# Patient Record
Sex: Female | Born: 1935 | Race: White | Hispanic: No | Marital: Married | Smoking: Never smoker
Health system: Southern US, Community
[De-identification: ages and names within clinical notes are randomized; demographics above are authoritative.]

## PROBLEM LIST (undated history)

## (undated) DIAGNOSIS — I10 Essential (primary) hypertension: Secondary | ICD-10-CM

## (undated) DIAGNOSIS — E785 Hyperlipidemia, unspecified: Secondary | ICD-10-CM

## (undated) HISTORY — PX: ABDOMINAL SURGERY: SHX537

---

## 1998-09-29 ENCOUNTER — Other Ambulatory Visit: Admission: RE | Admit: 1998-09-29 | Discharge: 1998-09-29 | Payer: Self-pay | Admitting: *Deleted

## 1999-09-15 ENCOUNTER — Other Ambulatory Visit: Admission: RE | Admit: 1999-09-15 | Discharge: 1999-09-15 | Payer: Self-pay | Admitting: Family Medicine

## 1999-09-18 ENCOUNTER — Encounter: Admission: RE | Admit: 1999-09-18 | Discharge: 1999-09-18 | Payer: Self-pay | Admitting: Family Medicine

## 1999-09-18 ENCOUNTER — Encounter: Payer: Self-pay | Admitting: Family Medicine

## 1999-12-29 ENCOUNTER — Inpatient Hospital Stay (HOSPITAL_COMMUNITY): Admission: EM | Admit: 1999-12-29 | Discharge: 1999-12-30 | Payer: Self-pay | Admitting: Emergency Medicine

## 1999-12-29 ENCOUNTER — Encounter: Payer: Self-pay | Admitting: Emergency Medicine

## 1999-12-29 ENCOUNTER — Encounter: Payer: Self-pay | Admitting: Anesthesiology

## 1999-12-30 ENCOUNTER — Encounter: Payer: Self-pay | Admitting: Urology

## 2000-01-11 ENCOUNTER — Ambulatory Visit (HOSPITAL_COMMUNITY): Admission: RE | Admit: 2000-01-11 | Discharge: 2000-01-11 | Payer: Self-pay | Admitting: Urology

## 2000-01-11 ENCOUNTER — Encounter: Payer: Self-pay | Admitting: Urology

## 2000-02-02 ENCOUNTER — Encounter: Payer: Self-pay | Admitting: Urology

## 2000-02-02 ENCOUNTER — Encounter: Admission: RE | Admit: 2000-02-02 | Discharge: 2000-02-02 | Payer: Self-pay | Admitting: Urology

## 2000-04-07 ENCOUNTER — Encounter: Payer: Self-pay | Admitting: Urology

## 2000-04-07 ENCOUNTER — Encounter: Admission: RE | Admit: 2000-04-07 | Discharge: 2000-04-07 | Payer: Self-pay | Admitting: Urology

## 2000-05-16 ENCOUNTER — Observation Stay (HOSPITAL_COMMUNITY): Admission: RE | Admit: 2000-05-16 | Discharge: 2000-05-17 | Payer: Self-pay

## 2000-09-19 ENCOUNTER — Encounter: Admission: RE | Admit: 2000-09-19 | Discharge: 2000-09-19 | Payer: Self-pay | Admitting: Family Medicine

## 2000-09-19 ENCOUNTER — Encounter: Payer: Self-pay | Admitting: Family Medicine

## 2000-10-10 ENCOUNTER — Other Ambulatory Visit: Admission: RE | Admit: 2000-10-10 | Discharge: 2000-10-10 | Payer: Self-pay | Admitting: Family Medicine

## 2001-04-28 ENCOUNTER — Encounter: Admission: RE | Admit: 2001-04-28 | Discharge: 2001-04-28 | Payer: Self-pay | Admitting: Urology

## 2001-04-28 ENCOUNTER — Encounter: Payer: Self-pay | Admitting: Urology

## 2001-09-19 ENCOUNTER — Encounter: Admission: RE | Admit: 2001-09-19 | Discharge: 2001-09-19 | Payer: Self-pay | Admitting: Family Medicine

## 2001-09-19 ENCOUNTER — Encounter: Payer: Self-pay | Admitting: Family Medicine

## 2001-10-11 ENCOUNTER — Other Ambulatory Visit: Admission: RE | Admit: 2001-10-11 | Discharge: 2001-10-11 | Payer: Self-pay | Admitting: Family Medicine

## 2002-07-06 ENCOUNTER — Encounter: Payer: Self-pay | Admitting: Family Medicine

## 2002-07-06 ENCOUNTER — Encounter: Admission: RE | Admit: 2002-07-06 | Discharge: 2002-07-06 | Payer: Self-pay | Admitting: Family Medicine

## 2002-09-25 ENCOUNTER — Encounter: Payer: Self-pay | Admitting: Family Medicine

## 2002-09-25 ENCOUNTER — Encounter: Admission: RE | Admit: 2002-09-25 | Discharge: 2002-09-25 | Payer: Self-pay | Admitting: Family Medicine

## 2002-10-11 ENCOUNTER — Other Ambulatory Visit: Admission: RE | Admit: 2002-10-11 | Discharge: 2002-10-11 | Payer: Self-pay | Admitting: Family Medicine

## 2003-10-03 ENCOUNTER — Encounter: Admission: RE | Admit: 2003-10-03 | Discharge: 2003-10-03 | Payer: Self-pay | Admitting: Family Medicine

## 2003-10-16 ENCOUNTER — Other Ambulatory Visit: Admission: RE | Admit: 2003-10-16 | Discharge: 2003-10-16 | Payer: Self-pay | Admitting: Family Medicine

## 2004-10-08 ENCOUNTER — Encounter: Admission: RE | Admit: 2004-10-08 | Discharge: 2004-10-08 | Payer: Self-pay | Admitting: Family Medicine

## 2004-10-20 ENCOUNTER — Other Ambulatory Visit: Admission: RE | Admit: 2004-10-20 | Discharge: 2004-10-20 | Payer: Self-pay | Admitting: Family Medicine

## 2005-11-01 ENCOUNTER — Encounter: Admission: RE | Admit: 2005-11-01 | Discharge: 2005-11-01 | Payer: Self-pay | Admitting: Family Medicine

## 2005-12-24 ENCOUNTER — Other Ambulatory Visit: Admission: RE | Admit: 2005-12-24 | Discharge: 2005-12-24 | Payer: Self-pay | Admitting: Family Medicine

## 2006-11-04 ENCOUNTER — Encounter: Admission: RE | Admit: 2006-11-04 | Discharge: 2006-11-04 | Payer: Self-pay | Admitting: Family Medicine

## 2007-01-02 ENCOUNTER — Encounter: Admission: RE | Admit: 2007-01-02 | Discharge: 2007-01-02 | Payer: Self-pay | Admitting: Family Medicine

## 2007-12-20 ENCOUNTER — Encounter: Admission: RE | Admit: 2007-12-20 | Discharge: 2007-12-20 | Payer: Self-pay | Admitting: Family Medicine

## 2008-02-23 ENCOUNTER — Other Ambulatory Visit: Admission: RE | Admit: 2008-02-23 | Discharge: 2008-02-23 | Payer: Self-pay | Admitting: Family Medicine

## 2009-02-12 ENCOUNTER — Encounter: Admission: RE | Admit: 2009-02-12 | Discharge: 2009-02-12 | Payer: Self-pay | Admitting: Geriatric Medicine

## 2010-03-05 ENCOUNTER — Encounter: Admission: RE | Admit: 2010-03-05 | Discharge: 2010-03-05 | Payer: Self-pay | Admitting: Geriatric Medicine

## 2011-01-22 NOTE — Discharge Summary (Signed)
[facility]  Patient:    Felicia Stephens, Felicia Stephens                       MRN: [id number] Adm. Date:  [id number] Disc. Date: [id number] Attending:  Londell Moh CC:         Milus Mallick, M.D.                           Discharge Summary  ADMISSION DIAGNOSES: 1. Ureteral calculus. 2. Hydronephrosis. 3. Septicemia.  PRINCIPAL PROCEDURES: 1. Cystoscopy. 2. Retrograde ureteropyelogram. 3. Double-J catheter insertion.  HISTORY OF PRESENT ILLNESS:  This 77 year old female is known to have a right-sided kidney stone.  The patient had abdominal pain and was seen in the emergency room by Milus Mallick, M.D., who felt that she might have an appendiceal abscess.  The CT scan showed marked hydronephrosis with a 1.4 cm stone in the proximal ureter.  The patient had a temperature that got as high as 105 degrees.  She was admitted for urgent placement of a stoma, as well as antibiotic therapy.  PAST MEDICAL HISTORY:  Remarkable for abdominal hernia.  She has a past history of urinary tract infections.  MEDICATIONS:  She takes no medications other than Claritin.  FAMILY HISTORY:  Has been placed on the chart.  SOCIAL HISTORY:  Has been placed on the chart.  REVIEW OF SYSTEMS:  Has been placed on the chart.  HOSPITAL COURSE:  The patient was taken to the operating room where she underwent immediate retrograde insertion of a double-J catheter.  The patient almost immediately felt better and defervesced.  The patient had some difficulty emptying her bladder and had 100 cc catheterized out, but later on that morning the patient was able to void without difficulty.  DISPOSITION:  She was afebrile and it was felt safe to send her home on appropriate medicines.  DISCHARGE MEDICATIONS:  The patient was given Levaquin and Darvocet.  FOLLOW-UP:  She will be returning later this week for ESWL to the stone. DD:  01/19/00 TD:  01/19/00 Job: [id number]

## 2011-01-22 NOTE — Op Note (Signed)
[facility]  Patient:    LAURELLE, SKIVER                       MRN: [id number] Proc. Date: 05/16/00 Adm. Date:  [id number] Attending:  Gennie Alma CC:         Vale Haven. Andrey Campanile, M.D.   Operative Report  CENTRAL Icard NUMBER:  [id number]  PREOPERATIVE DIAGNOSIS:  Large ventral hernia of suprapubic region.  POSTOPERATIVE DIAGNOSIS:  Large ventral hernia of suprapubic region.  OPERATION:  Repair of large ventral hernia with mesh.  SURGEON:  Milus Mallick, M.D.  ASSISTANT:  Rose Phi. Maple Hudson, M.D.  ANESTHESIA:  General endotracheal.  DESCRIPTION OF PROCEDURE:  Under adequate general endotracheal anesthesia, the patients abdomen wass prepared and draped in the usual fashion.  There wa s a grapefruit size ventral hernia just above the umbilicus.  It was chronically incarcerated but was able to be reduced under anesthesia.  A 6 inch transverse incision was made overlying it above the umbilicus and almost immediately we were within the hernia sac.  There was a transverse colon in the sac and some omentum and this was reduced easily.  The sac was dissected away from the subcutaneous tissue all around it and the fascia was exposed all around the defect.  The actual defect in the fascia was 3 to 4 cm in diameter.   After all the fascia was exposed and a border around the defect approximately 4 or 5 cm in width, the sac was excised using Bovie electrocoagulation.  The sac was then repaired by inserting atrium mesh within the abdominal cavity and placing it behind the abdominal wall.  A piece of mesh measuring 7.5 x 10 cm was inserted into the defect and fixed up to the abdominal wall with interrupted vertical mattress sutures of 0 Novofil.  The defect was then closed over the mesh with interrupted sutures of 0 Novofil.  The umbilicus was fixed down to the fascia with 3-0 Vicryl.  A 19 Blake drain was inserted into the wound and brought out through  the stab wound in the left inferior flap. It was sewed to the skin with 3-0 nylon.  The subcutaneous fat was reapproximated with interrupted sutures of 3-0 Vicryl and the skin was approximated with a generic skin staple 35-W.  A sterile dressing was applied. Estimated blood loss for the procedure was negligible.  The patient tolerated the procedure well and left the operating room in satisfactory condition. DD:  05/16/00 TD:  05/17/00 Job: [id number]

## 2011-03-23 ENCOUNTER — Other Ambulatory Visit: Payer: Self-pay | Admitting: Geriatric Medicine

## 2011-03-23 DIAGNOSIS — Z1231 Encounter for screening mammogram for malignant neoplasm of breast: Secondary | ICD-10-CM

## 2011-04-05 ENCOUNTER — Ambulatory Visit: Payer: Self-pay

## 2011-04-19 ENCOUNTER — Ambulatory Visit
Admission: RE | Admit: 2011-04-19 | Discharge: 2011-04-19 | Disposition: A | Discharge status: Home / Self Care | Payer: Medicare Other | Source: Ambulatory Visit | Attending: Geriatric Medicine | Admitting: Geriatric Medicine

## 2011-04-19 DIAGNOSIS — Z1231 Encounter for screening mammogram for malignant neoplasm of breast: Secondary | ICD-10-CM

## 2011-05-18 ENCOUNTER — Other Ambulatory Visit (HOSPITAL_COMMUNITY)
Admission: RE | Admit: 2011-05-18 | Discharge: 2011-05-18 | Disposition: A | Discharge status: Home / Self Care | Payer: Medicare Other | Source: Ambulatory Visit | Attending: Geriatric Medicine | Admitting: Geriatric Medicine

## 2011-05-18 ENCOUNTER — Other Ambulatory Visit: Payer: Self-pay | Admitting: Geriatric Medicine

## 2011-05-18 DIAGNOSIS — Z1159 Encounter for screening for other viral diseases: Secondary | ICD-10-CM | POA: Insufficient documentation

## 2011-05-18 DIAGNOSIS — Z124 Encounter for screening for malignant neoplasm of cervix: Secondary | ICD-10-CM | POA: Insufficient documentation

## 2011-08-03 ENCOUNTER — Other Ambulatory Visit: Payer: Self-pay | Admitting: Dermatology

## 2011-11-02 DIAGNOSIS — J019 Acute sinusitis, unspecified: Secondary | ICD-10-CM | POA: Diagnosis not present

## 2011-11-05 DIAGNOSIS — L600 Ingrowing nail: Secondary | ICD-10-CM | POA: Diagnosis not present

## 2011-11-15 DIAGNOSIS — I8390 Asymptomatic varicose veins of unspecified lower extremity: Secondary | ICD-10-CM | POA: Diagnosis not present

## 2011-11-15 DIAGNOSIS — J309 Allergic rhinitis, unspecified: Secondary | ICD-10-CM | POA: Diagnosis not present

## 2011-11-15 DIAGNOSIS — Z79899 Other long term (current) drug therapy: Secondary | ICD-10-CM | POA: Diagnosis not present

## 2011-11-15 DIAGNOSIS — H6120 Impacted cerumen, unspecified ear: Secondary | ICD-10-CM | POA: Diagnosis not present

## 2011-11-15 DIAGNOSIS — I10 Essential (primary) hypertension: Secondary | ICD-10-CM | POA: Diagnosis not present

## 2011-11-15 DIAGNOSIS — E7800 Pure hypercholesterolemia, unspecified: Secondary | ICD-10-CM | POA: Diagnosis not present

## 2011-12-01 DIAGNOSIS — M1710 Unilateral primary osteoarthritis, unspecified knee: Secondary | ICD-10-CM | POA: Diagnosis not present

## 2011-12-01 DIAGNOSIS — M545 Low back pain: Secondary | ICD-10-CM | POA: Diagnosis not present

## 2011-12-27 DIAGNOSIS — I10 Essential (primary) hypertension: Secondary | ICD-10-CM | POA: Diagnosis not present

## 2011-12-27 DIAGNOSIS — Z79899 Other long term (current) drug therapy: Secondary | ICD-10-CM | POA: Diagnosis not present

## 2011-12-27 DIAGNOSIS — M25569 Pain in unspecified knee: Secondary | ICD-10-CM | POA: Diagnosis not present

## 2012-04-18 ENCOUNTER — Other Ambulatory Visit: Payer: Self-pay | Admitting: Geriatric Medicine

## 2012-04-18 DIAGNOSIS — Z1231 Encounter for screening mammogram for malignant neoplasm of breast: Secondary | ICD-10-CM

## 2012-04-24 DIAGNOSIS — E7800 Pure hypercholesterolemia, unspecified: Secondary | ICD-10-CM | POA: Diagnosis not present

## 2012-04-24 DIAGNOSIS — Z79899 Other long term (current) drug therapy: Secondary | ICD-10-CM | POA: Diagnosis not present

## 2012-04-24 DIAGNOSIS — I10 Essential (primary) hypertension: Secondary | ICD-10-CM | POA: Diagnosis not present

## 2012-05-02 ENCOUNTER — Ambulatory Visit
Admission: RE | Admit: 2012-05-02 | Discharge: 2012-05-02 | Disposition: A | Discharge status: Home / Self Care | Payer: Medicare Other | Source: Ambulatory Visit | Attending: Geriatric Medicine | Admitting: Geriatric Medicine

## 2012-05-02 DIAGNOSIS — Z1231 Encounter for screening mammogram for malignant neoplasm of breast: Secondary | ICD-10-CM

## 2012-06-01 DIAGNOSIS — Z23 Encounter for immunization: Secondary | ICD-10-CM | POA: Diagnosis not present

## 2012-10-31 DIAGNOSIS — Z79899 Other long term (current) drug therapy: Secondary | ICD-10-CM | POA: Diagnosis not present

## 2012-10-31 DIAGNOSIS — Z0000 Encounter for general adult medical examination without abnormal findings: Secondary | ICD-10-CM | POA: Diagnosis not present

## 2012-10-31 DIAGNOSIS — Z1331 Encounter for screening for depression: Secondary | ICD-10-CM | POA: Diagnosis not present

## 2012-10-31 DIAGNOSIS — I10 Essential (primary) hypertension: Secondary | ICD-10-CM | POA: Diagnosis not present

## 2012-10-31 DIAGNOSIS — E7800 Pure hypercholesterolemia, unspecified: Secondary | ICD-10-CM | POA: Diagnosis not present

## 2012-10-31 DIAGNOSIS — Z23 Encounter for immunization: Secondary | ICD-10-CM | POA: Diagnosis not present

## 2013-03-28 DIAGNOSIS — E7800 Pure hypercholesterolemia, unspecified: Secondary | ICD-10-CM | POA: Diagnosis not present

## 2013-03-28 DIAGNOSIS — I10 Essential (primary) hypertension: Secondary | ICD-10-CM | POA: Diagnosis not present

## 2013-05-10 ENCOUNTER — Other Ambulatory Visit: Payer: Self-pay | Admitting: Gastroenterology

## 2013-05-10 DIAGNOSIS — D126 Benign neoplasm of colon, unspecified: Secondary | ICD-10-CM | POA: Diagnosis not present

## 2013-05-10 DIAGNOSIS — K5730 Diverticulosis of large intestine without perforation or abscess without bleeding: Secondary | ICD-10-CM | POA: Diagnosis not present

## 2013-05-10 DIAGNOSIS — Z1211 Encounter for screening for malignant neoplasm of colon: Secondary | ICD-10-CM | POA: Diagnosis not present

## 2013-06-28 ENCOUNTER — Other Ambulatory Visit: Payer: Self-pay

## 2013-06-28 DIAGNOSIS — Z1231 Encounter for screening mammogram for malignant neoplasm of breast: Secondary | ICD-10-CM

## 2013-07-03 DIAGNOSIS — Z23 Encounter for immunization: Secondary | ICD-10-CM | POA: Diagnosis not present

## 2013-07-27 ENCOUNTER — Ambulatory Visit (INDEPENDENT_AMBULATORY_CARE_PROVIDER_SITE_OTHER): Payer: Medicare Other

## 2013-07-27 VITALS — BP 165/89 | HR 79 | Resp 12 | Ht 68.0 in | Wt 170.0 lb

## 2013-07-27 DIAGNOSIS — L600 Ingrowing nail: Secondary | ICD-10-CM | POA: Diagnosis not present

## 2013-07-27 DIAGNOSIS — L03039 Cellulitis of unspecified toe: Secondary | ICD-10-CM

## 2013-07-27 MED ORDER — CEPHALEXIN 500 MG PO CAPS: 500.0000 mg | ORAL_CAPSULE | Freq: Three times a day (TID) | ORAL | Status: DC

## 2013-07-27 NOTE — Progress Notes (Signed)
  Subjective:    Patient ID: Felicia Stephens, female    DOB: [birth year], 79 y.o.   MRN: [id number]  HPI Comments: '' RT FOOT BIG TOENAIL IS SORE FOR  2 WEEKS AND ITS GETTING WORSE. I JUST SOAK IT AS NEEDED, BUT NO TREATMENT''     Review of Systems  Skin: Positive for color change.  Allergic/Immunologic: Positive for environmental allergies.  All other systems reviewed and are negative.       Objective:   Physical Exam Neurovascular status is intact with pedal pulses palpable DP postal for PT pulse one over 4 bilateral. Refill time 3 seconds all digits. Skin temperature warm turgor normal mild varicosities noted. Orthopedic exam remarkable for semirigid digital contractures 2 through 5 bilateral. Hallux is rectus. Dermatologically there is keratosis incurvation of right hallux nail lateral border painful tender with the edema and erythema lateral nail fold being noted. There is no purulent discharge or drainage noted slight serous bloody drainage of the border is identified. Patient had similar nail procedure nail problem done on the contralateral left foot just over a year ago.      Assessment & Plan:  Assessment ingrowing nail with paronychia right great toe lateral border. Plan at this time is nail excision partial with phenol matricectomy. At this time local anesthetic block Mr. total of 3 cc 50-50 mixture of 2% Xylocaine plain and 0.5% Marcaine plain in a digital block fashion. Hemostasis was acquired with a Coflex wrap in and rubber band tourniquet. At this time the lateral border is excised in a longitudinal fashion. Phenol matricectomy x3 applications followed by alcohol wash Silvadene cream and a gauze dressing being applied. Patient tolerated procedure well was given instructions for daily Betadine water soaks prescription for cephalexin 500 mg 3 times a day x10 days an appointment for followup ulcers and have been made. Recommended Tylenol as needed for pain recheck in 2-3 weeks for  followup for nail check. Maintain comfortable shoes in the interim.  Alvan Dame DPM

## 2013-07-27 NOTE — Patient Instructions (Signed)

## 2013-07-30 ENCOUNTER — Ambulatory Visit
Admission: RE | Admit: 2013-07-30 | Discharge: 2013-07-30 | Disposition: A | Discharge status: Home / Self Care | Payer: Medicare Other | Source: Ambulatory Visit

## 2013-07-30 DIAGNOSIS — Z1231 Encounter for screening mammogram for malignant neoplasm of breast: Secondary | ICD-10-CM | POA: Diagnosis not present

## 2013-08-14 ENCOUNTER — Ambulatory Visit (INDEPENDENT_AMBULATORY_CARE_PROVIDER_SITE_OTHER): Payer: Medicare Other

## 2013-08-14 VITALS — BP 152/88 | HR 79 | Resp 12

## 2013-08-14 DIAGNOSIS — Z09 Encounter for follow-up examination after completed treatment for conditions other than malignant neoplasm: Secondary | ICD-10-CM

## 2013-08-14 DIAGNOSIS — L600 Ingrowing nail: Secondary | ICD-10-CM

## 2013-08-14 NOTE — Patient Instructions (Signed)

## 2013-08-14 NOTE — Progress Notes (Signed)
   Subjective:    Patient ID: Felicia Stephens, female    DOB: [birth year], 79 y.o.   MRN: [id number]  HPI patient presents at this time following AP nail procedure of the right great toe border doing well no discharge or drainage is noted    Review of Systems deferred at this visit     Objective:   Physical Exam Neurovascular status is intact with palpable pedal pulses DP and PT +2/4. Refill timed 3-4 seconds there is no discharge or drainage the nail appears to be completely healed suggest monitoring and recheck in the future and as-needed basis no pain or discomfort noted slight eschar present should followup with the next month.       Assessment & Plan:  Assessment good postop progress following AP nail procedure right great toe lateral border patient discharged from our care at this time to an as-needed basis  Alvan Dame DPM

## 2013-12-13 DIAGNOSIS — J209 Acute bronchitis, unspecified: Secondary | ICD-10-CM | POA: Diagnosis not present

## 2013-12-13 DIAGNOSIS — J309 Allergic rhinitis, unspecified: Secondary | ICD-10-CM | POA: Diagnosis not present

## 2013-12-21 DIAGNOSIS — Z23 Encounter for immunization: Secondary | ICD-10-CM | POA: Diagnosis not present

## 2013-12-21 DIAGNOSIS — I10 Essential (primary) hypertension: Secondary | ICD-10-CM | POA: Diagnosis not present

## 2013-12-21 DIAGNOSIS — Z1331 Encounter for screening for depression: Secondary | ICD-10-CM | POA: Diagnosis not present

## 2013-12-21 DIAGNOSIS — Z0000 Encounter for general adult medical examination without abnormal findings: Secondary | ICD-10-CM | POA: Diagnosis not present

## 2013-12-21 DIAGNOSIS — Z79899 Other long term (current) drug therapy: Secondary | ICD-10-CM | POA: Diagnosis not present

## 2013-12-21 DIAGNOSIS — J301 Allergic rhinitis due to pollen: Secondary | ICD-10-CM | POA: Diagnosis not present

## 2013-12-21 DIAGNOSIS — E7800 Pure hypercholesterolemia, unspecified: Secondary | ICD-10-CM | POA: Diagnosis not present

## 2014-05-23 DIAGNOSIS — H26499 Other secondary cataract, unspecified eye: Secondary | ICD-10-CM | POA: Diagnosis not present

## 2014-05-23 DIAGNOSIS — Z961 Presence of intraocular lens: Secondary | ICD-10-CM | POA: Diagnosis not present

## 2014-06-05 DIAGNOSIS — H26499 Other secondary cataract, unspecified eye: Secondary | ICD-10-CM | POA: Diagnosis not present

## 2014-06-14 DIAGNOSIS — Z23 Encounter for immunization: Secondary | ICD-10-CM | POA: Diagnosis not present

## 2014-06-24 DIAGNOSIS — M859 Disorder of bone density and structure, unspecified: Secondary | ICD-10-CM | POA: Diagnosis not present

## 2014-06-24 DIAGNOSIS — E780 Pure hypercholesterolemia: Secondary | ICD-10-CM | POA: Diagnosis not present

## 2014-06-24 DIAGNOSIS — I10 Essential (primary) hypertension: Secondary | ICD-10-CM | POA: Diagnosis not present

## 2014-07-17 DIAGNOSIS — M8580 Other specified disorders of bone density and structure, unspecified site: Secondary | ICD-10-CM | POA: Diagnosis not present

## 2014-07-24 DIAGNOSIS — M8580 Other specified disorders of bone density and structure, unspecified site: Secondary | ICD-10-CM | POA: Diagnosis not present

## 2014-08-06 ENCOUNTER — Other Ambulatory Visit: Payer: Self-pay

## 2014-08-06 DIAGNOSIS — Z1231 Encounter for screening mammogram for malignant neoplasm of breast: Secondary | ICD-10-CM

## 2014-08-21 ENCOUNTER — Ambulatory Visit
Admission: RE | Admit: 2014-08-21 | Discharge: 2014-08-21 | Disposition: A | Discharge status: Home / Self Care | Payer: Medicare Other | Source: Ambulatory Visit

## 2014-08-21 DIAGNOSIS — Z1231 Encounter for screening mammogram for malignant neoplasm of breast: Secondary | ICD-10-CM | POA: Diagnosis not present

## 2014-12-30 DIAGNOSIS — E780 Pure hypercholesterolemia: Secondary | ICD-10-CM | POA: Diagnosis not present

## 2014-12-30 DIAGNOSIS — I10 Essential (primary) hypertension: Secondary | ICD-10-CM | POA: Diagnosis not present

## 2014-12-30 DIAGNOSIS — Z1389 Encounter for screening for other disorder: Secondary | ICD-10-CM | POA: Diagnosis not present

## 2014-12-30 DIAGNOSIS — Z79899 Other long term (current) drug therapy: Secondary | ICD-10-CM | POA: Diagnosis not present

## 2014-12-30 DIAGNOSIS — Z0000 Encounter for general adult medical examination without abnormal findings: Secondary | ICD-10-CM | POA: Diagnosis not present

## 2015-06-17 DIAGNOSIS — Z23 Encounter for immunization: Secondary | ICD-10-CM | POA: Diagnosis not present

## 2015-07-01 DIAGNOSIS — I10 Essential (primary) hypertension: Secondary | ICD-10-CM | POA: Diagnosis not present

## 2015-10-01 ENCOUNTER — Other Ambulatory Visit: Payer: Self-pay

## 2015-10-01 DIAGNOSIS — Z1231 Encounter for screening mammogram for malignant neoplasm of breast: Secondary | ICD-10-CM

## 2015-10-16 ENCOUNTER — Other Ambulatory Visit: Payer: Self-pay | Admitting: Nurse Practitioner

## 2015-10-16 ENCOUNTER — Ambulatory Visit
Admission: RE | Admit: 2015-10-16 | Discharge: 2015-10-16 | Disposition: A | Discharge status: Home / Self Care | Payer: Medicare Other | Source: Ambulatory Visit | Attending: Nurse Practitioner | Admitting: Nurse Practitioner

## 2015-10-16 DIAGNOSIS — J40 Bronchitis, not specified as acute or chronic: Secondary | ICD-10-CM

## 2015-10-23 ENCOUNTER — Ambulatory Visit
Admission: RE | Admit: 2015-10-23 | Discharge: 2015-10-23 | Disposition: A | Discharge status: Home / Self Care | Payer: Medicare Other | Source: Ambulatory Visit

## 2015-10-23 DIAGNOSIS — Z1231 Encounter for screening mammogram for malignant neoplasm of breast: Secondary | ICD-10-CM

## 2016-11-09 ENCOUNTER — Other Ambulatory Visit: Payer: Self-pay | Admitting: Geriatric Medicine

## 2016-11-09 DIAGNOSIS — Z1231 Encounter for screening mammogram for malignant neoplasm of breast: Secondary | ICD-10-CM

## 2016-11-26 ENCOUNTER — Ambulatory Visit
Admission: RE | Admit: 2016-11-26 | Discharge: 2016-11-26 | Disposition: A | Discharge status: Home / Self Care | Payer: Medicare Other | Source: Ambulatory Visit | Attending: Geriatric Medicine | Admitting: Geriatric Medicine

## 2016-11-26 DIAGNOSIS — Z1231 Encounter for screening mammogram for malignant neoplasm of breast: Secondary | ICD-10-CM

## 2017-12-30 ENCOUNTER — Other Ambulatory Visit: Payer: Self-pay | Admitting: Geriatric Medicine

## 2017-12-30 DIAGNOSIS — Z1231 Encounter for screening mammogram for malignant neoplasm of breast: Secondary | ICD-10-CM

## 2018-01-24 ENCOUNTER — Ambulatory Visit
Admission: RE | Admit: 2018-01-24 | Discharge: 2018-01-24 | Disposition: A | Discharge status: Home / Self Care | Payer: Medicare Other | Source: Ambulatory Visit | Attending: Geriatric Medicine | Admitting: Geriatric Medicine

## 2018-01-24 DIAGNOSIS — Z1231 Encounter for screening mammogram for malignant neoplasm of breast: Secondary | ICD-10-CM

## 2018-12-20 ENCOUNTER — Emergency Department (HOSPITAL_COMMUNITY): Payer: Medicare Other

## 2018-12-20 ENCOUNTER — Inpatient Hospital Stay (HOSPITAL_COMMUNITY)
Admission: EM | Admit: 2018-12-20 | Discharge: 2018-12-29 | DRG: 329 | Disposition: A | Discharge status: Home Health | Payer: Medicare Other | Attending: Internal Medicine | Admitting: Internal Medicine

## 2018-12-20 ENCOUNTER — Encounter (HOSPITAL_COMMUNITY): Payer: Self-pay | Admitting: Emergency Medicine

## 2018-12-20 ENCOUNTER — Other Ambulatory Visit: Payer: Self-pay

## 2018-12-20 DIAGNOSIS — K430 Incisional hernia with obstruction, without gangrene: Secondary | ICD-10-CM | POA: Diagnosis not present

## 2018-12-20 DIAGNOSIS — E871 Hypo-osmolality and hyponatremia: Secondary | ICD-10-CM | POA: Diagnosis present

## 2018-12-20 DIAGNOSIS — Z79899 Other long term (current) drug therapy: Secondary | ICD-10-CM

## 2018-12-20 DIAGNOSIS — Z72820 Sleep deprivation: Secondary | ICD-10-CM

## 2018-12-20 DIAGNOSIS — D62 Acute posthemorrhagic anemia: Secondary | ICD-10-CM | POA: Diagnosis not present

## 2018-12-20 DIAGNOSIS — R112 Nausea with vomiting, unspecified: Secondary | ICD-10-CM | POA: Diagnosis not present

## 2018-12-20 DIAGNOSIS — K436 Other and unspecified ventral hernia with obstruction, without gangrene: Secondary | ICD-10-CM

## 2018-12-20 DIAGNOSIS — K56609 Unspecified intestinal obstruction, unspecified as to partial versus complete obstruction: Secondary | ICD-10-CM

## 2018-12-20 DIAGNOSIS — I10 Essential (primary) hypertension: Secondary | ICD-10-CM | POA: Diagnosis present

## 2018-12-20 DIAGNOSIS — H9190 Unspecified hearing loss, unspecified ear: Secondary | ICD-10-CM | POA: Diagnosis present

## 2018-12-20 DIAGNOSIS — Z978 Presence of other specified devices: Secondary | ICD-10-CM

## 2018-12-20 DIAGNOSIS — E860 Dehydration: Secondary | ICD-10-CM | POA: Diagnosis present

## 2018-12-20 DIAGNOSIS — Z7951 Long term (current) use of inhaled steroids: Secondary | ICD-10-CM

## 2018-12-20 DIAGNOSIS — E876 Hypokalemia: Secondary | ICD-10-CM | POA: Diagnosis present

## 2018-12-20 DIAGNOSIS — N179 Acute kidney failure, unspecified: Secondary | ICD-10-CM | POA: Diagnosis present

## 2018-12-20 DIAGNOSIS — G9341 Metabolic encephalopathy: Secondary | ICD-10-CM | POA: Diagnosis present

## 2018-12-20 DIAGNOSIS — K559 Vascular disorder of intestine, unspecified: Secondary | ICD-10-CM | POA: Diagnosis present

## 2018-12-20 DIAGNOSIS — E785 Hyperlipidemia, unspecified: Secondary | ICD-10-CM | POA: Diagnosis present

## 2018-12-20 DIAGNOSIS — E872 Acidosis: Secondary | ICD-10-CM | POA: Diagnosis present

## 2018-12-20 DIAGNOSIS — E878 Other disorders of electrolyte and fluid balance, not elsewhere classified: Secondary | ICD-10-CM | POA: Diagnosis present

## 2018-12-20 HISTORY — DX: Essential (primary) hypertension: I10

## 2018-12-20 HISTORY — DX: Hyperlipidemia, unspecified: E78.5

## 2018-12-20 LAB — CBC WITH DIFFERENTIAL/PLATELET
Abs Immature Granulocytes: 0.1 10*3/uL — ABNORMAL HIGH (ref 0.00–0.07)
Basophils Absolute: 0 10*3/uL (ref 0.0–0.1)
Basophils Relative: 0 %
Eosinophils Absolute: 0 10*3/uL (ref 0.0–0.5)
Eosinophils Relative: 0 %
HCT: 47.7 % — ABNORMAL HIGH (ref 36.0–46.0)
Hemoglobin: 16.2 g/dL — ABNORMAL HIGH (ref 12.0–15.0)
Lymphocytes Relative: 6 %
Lymphs Abs: 0.8 10*3/uL (ref 0.7–4.0)
MCH: 31.3 pg (ref 26.0–34.0)
MCHC: 34 g/dL (ref 30.0–36.0)
MCV: 92.1 fL (ref 80.0–100.0)
Metamyelocytes Relative: 1 %
Monocytes Absolute: 1.6 10*3/uL — ABNORMAL HIGH (ref 0.1–1.0)
Monocytes Relative: 12 %
Neutro Abs: 10.6 10*3/uL — ABNORMAL HIGH (ref 1.7–7.7)
Neutrophils Relative %: 81 %
Platelets: 310 10*3/uL (ref 150–400)
RBC: 5.18 MIL/uL — ABNORMAL HIGH (ref 3.87–5.11)
RDW: 12.7 % (ref 11.5–15.5)
WBC: 13.1 10*3/uL — ABNORMAL HIGH (ref 4.0–10.5)
nRBC: 0 % (ref 0.0–0.2)
nRBC: 0 /100 WBC

## 2018-12-20 LAB — COMPREHENSIVE METABOLIC PANEL
ALT: 12 U/L (ref 0–44)
AST: 22 U/L (ref 15–41)
Albumin: 3.1 g/dL — ABNORMAL LOW (ref 3.5–5.0)
Alkaline Phosphatase: 80 U/L (ref 38–126)
Anion gap: 13 (ref 5–15)
BUN: 50 mg/dL — ABNORMAL HIGH (ref 8–23)
CO2: 23 mmol/L (ref 22–32)
Calcium: 10.2 mg/dL (ref 8.9–10.3)
Chloride: 94 mmol/L — ABNORMAL LOW (ref 98–111)
Creatinine, Ser: 2.24 mg/dL — ABNORMAL HIGH (ref 0.44–1.00)
GFR calc Af Amer: 23 mL/min — ABNORMAL LOW (ref >60)
GFR calc non Af Amer: 20 mL/min — ABNORMAL LOW (ref >60)
Glucose, Bld: 128 mg/dL — ABNORMAL HIGH (ref 70–99)
Potassium: 3.4 mmol/L — ABNORMAL LOW (ref 3.5–5.1)
Sodium: 130 mmol/L — ABNORMAL LOW (ref 135–145)
Total Bilirubin: 3.5 mg/dL — ABNORMAL HIGH (ref 0.3–1.2)
Total Protein: 6.9 g/dL (ref 6.5–8.1)

## 2018-12-20 LAB — LACTIC ACID, PLASMA
Lactic Acid, Venous: 2.3 mmol/L (ref 0.5–1.9)
Lactic Acid, Venous: 2.1 mmol/L (ref 0.5–1.9)

## 2018-12-20 LAB — LIPASE, BLOOD: Lipase: 23 U/L (ref 11–51)

## 2018-12-20 MED ORDER — FENTANYL CITRATE (PF) 100 MCG/2ML IJ SOLN
50.0000 ug | Freq: Once | INTRAMUSCULAR | Status: AC
  Administered 2018-12-20: 50 ug via INTRAVENOUS
  Filled 2018-12-20: qty 2

## 2018-12-20 MED ORDER — SODIUM CHLORIDE 0.9 % IV BOLUS
1000.0000 mL | Freq: Once | INTRAVENOUS | Status: AC
  Administered 2018-12-20: 1000 mL via INTRAVENOUS

## 2018-12-20 MED ORDER — ONDANSETRON HCL 4 MG/2ML IJ SOLN
4.0000 mg | Freq: Once | INTRAMUSCULAR | Status: AC
  Administered 2018-12-20: 4 mg via INTRAVENOUS
  Filled 2018-12-20: qty 2

## 2018-12-20 MED ORDER — SODIUM CHLORIDE 0.9 % IV SOLN
Freq: Once | INTRAVENOUS | Status: AC
  Administered 2018-12-20: 23:00:00 via INTRAVENOUS

## 2018-12-20 MED ORDER — PIPERACILLIN-TAZOBACTAM 3.375 G IVPB 30 MIN
3.3750 g | Freq: Once | INTRAVENOUS | Status: AC
  Administered 2018-12-20: 3.375 g via INTRAVENOUS
  Filled 2018-12-20: qty 50

## 2018-12-20 NOTE — Progress Notes (Signed)
Pt is active Catholic for whom faith and prayer are wells of strength. Pt spoke of worry about her husband who has been ill at home, but also relief that their adult children are able to stay with them due to work from home orders.  Offered prayer.  Pt reported no pain due to medicine, and was beginning to feel sleepy.    Please call for f/u as needed.   Luana Shu [phone]    12/20/18 2200  Clinical Encounter Type  Visited With Patient  Visit Type Initial;Spiritual support  Referral From Patient;Physician  Consult/Referral To Chaplain  Spiritual Encounters  Spiritual Needs Prayer  Stress Factors  Patient Stress Factors Family relationships;Health changes

## 2018-12-20 NOTE — ED Provider Notes (Signed)
[facility] EMERGENCY DEPARTMENT Provider Note   CSN: [id number] Arrival date & time: 12/20/18  1918    History   Chief Complaint Chief Complaint  Patient presents with   Abdominal Pain   Nausea   Emesis    HPI Felicia Stephens is a 85 y.o. female.     HPI   63 yo F with PMHx HTN, HLD, here with nausea, vomiting.  The patient has an extensive history of intra-abdominal surgeries with multiple inguinal and ventral hernia repairs.  She states that over the last several days, she has had mild abdominal cramping and then since Sunday, has had nausea, abdominal distention, and increasing swelling and pain over her ventral hernia site.  She is had no passage of flatus and has had constipation since then.  She has had associated nausea and yellow-green emesis.  No fevers or chills.  No recent sick contacts.  Denies known history of obstructions, though it sounds like with her previous inguinal hernia, which was repaired in the 90s, she did require an NG tube.  No urinary symptoms.  No other complaints per nursing medication changes.  Past Medical History:  Diagnosis Date   Hyperlipidemia    Hypertension     There are no active problems to display for this patient.   Past Surgical History:  Procedure Laterality Date   ABDOMINAL SURGERY       OB History   No obstetric history on file.      Home Medications    Prior to Admission medications   Medication Sig Start Date End Date Taking? Authorizing Provider  atorvastatin (LIPITOR) 10 MG tablet Take 10 mg by mouth daily.    [provider]  benazepril-hydrochlorthiazide (LOTENSIN HCT) 10-12.5 MG per tablet Take 1 tablet by mouth daily.    [provider]  cephALEXin (KEFLEX) 500 MG capsule Take 1 capsule (500 mg total) by mouth 3 (three) times daily. 07/27/13   Harriet Masson, DPM  fluticasone (FLONASE) 50 MCG/ACT nasal spray Place into both nostrils daily.    [provider]   loratadine (CLARITIN) 10 MG tablet Take 10 mg by mouth daily.    [provider]  Multiple Vitamin (MULTIVITAMIN) tablet Take 1 tablet by mouth daily.    [provider]    Family History Family History  Problem Relation Age of Onset   Breast cancer Sister     Social History Social History   Tobacco Use   Smoking status: Never Smoker   Smokeless tobacco: Never Used  Substance Use Topics   Alcohol use: No   Drug use: No     Allergies   Patient has no known allergies.   Review of Systems Review of Systems  Constitutional: Positive for fatigue. Negative for chills and fever.  HENT: Negative for congestion and rhinorrhea.   Eyes: Negative for visual disturbance.  Respiratory: Negative for cough, shortness of breath and wheezing.   Cardiovascular: Negative for chest pain and leg swelling.  Gastrointestinal: Positive for abdominal distention, abdominal pain, nausea and vomiting. Negative for diarrhea.  Genitourinary: Negative for dysuria and flank pain.  Musculoskeletal: Negative for neck pain and neck stiffness.  Skin: Negative for rash and wound.  Allergic/Immunologic: Negative for immunocompromised state.  Neurological: Positive for weakness. Negative for syncope and headaches.  All other systems reviewed and are negative.    Physical Exam Updated Vital Signs BP (!) 100/50    Pulse (!) 109    Temp 98 F (36.7 C) (  Temporal)    Resp 20    SpO2 97%   Physical Exam Vitals signs and nursing note reviewed.  Constitutional:      General: She is not in acute distress.    Appearance: She is well-developed.  HENT:     Head: Normocephalic and atraumatic.     Comments: Dry mucous membranes Eyes:     Conjunctiva/sclera: Conjunctivae normal.  Neck:     Musculoskeletal: Neck supple.  Cardiovascular:     Rate and Rhythm: Normal rate and regular rhythm.     Heart sounds: Normal heart sounds. No murmur. No friction rub.  Pulmonary:     Effort:  Pulmonary effort is normal. No respiratory distress.     Breath sounds: Normal breath sounds. No wheezing or rales.  Abdominal:     General: Bowel sounds are decreased. There is no distension.     Palpations: Abdomen is soft.     Tenderness: There is abdominal tenderness in the right upper quadrant and periumbilical area.     Comments: Large, firm ventral hernia with tenderness to palpation.  No overlying erythema.  Skin:    General: Skin is warm.     Capillary Refill: Capillary refill takes less than 2 seconds.  Neurological:     Mental Status: She is alert and oriented to person, place, and time.     Motor: No abnormal muscle tone.      ED Treatments / Results  Labs (all labs ordered are listed, but only abnormal results are displayed) Labs Reviewed  CBC WITH DIFFERENTIAL/PLATELET - Abnormal; Notable for the following components:      Result Value   WBC 13.1 (*)    RBC 5.18 (*)    Hemoglobin 16.2 (*)    HCT 47.7 (*)    Neutro Abs 10.6 (*)    Monocytes Absolute 1.6 (*)    Abs Immature Granulocytes 0.10 (*)    All other components within normal limits  LACTIC ACID, PLASMA - Abnormal; Notable for the following components:   Lactic Acid, Venous 2.3 (*)    All other components within normal limits  LACTIC ACID, PLASMA - Abnormal; Notable for the following components:   Lactic Acid, Venous 2.1 (*)    All other components within normal limits  COMPREHENSIVE METABOLIC PANEL - Abnormal; Notable for the following components:   Sodium 130 (*)    Potassium 3.4 (*)    Chloride 94 (*)    Glucose, Bld 128 (*)    BUN 50 (*)    Creatinine, Ser 2.24 (*)    Albumin 3.1 (*)    Total Bilirubin 3.5 (*)    GFR calc non Af Amer 20 (*)    GFR calc Af Amer 23 (*)    All other components within normal limits  CULTURE, BLOOD (ROUTINE X 2)  CULTURE, BLOOD (ROUTINE X 2)  LIPASE, BLOOD    EKG EKG Interpretation  Date/Time:  Wednesday December 20 2018 19:24:20 EDT Ventricular Rate:  113 PR  Interval:    QRS Duration: 133 QT Interval:  325 QTC Calculation: 446 R Axis:   -33 Text Interpretation:  Sinus tachycardia Right atrial enlargement Nonspecific intraventricular conduction delay Abnormal inferior Q waves Confirmed by Duffy Bruce [phone]) on 12/21/2018 12:09:39 AM   Radiology Ct Abdomen Pelvis Wo Contrast  Result Date: 12/20/2018 CLINICAL DATA:  85 y/o  F; abdominal distention, nausea, vomiting. EXAM: CT ABDOMEN AND PELVIS WITHOUT CONTRAST TECHNIQUE: Multidetector CT imaging of the abdomen and pelvis was performed following  the standard protocol without IV contrast. COMPARISON:  None. FINDINGS: Lower chest: No acute abnormality. Hepatobiliary: No focal liver abnormality is seen. Status post cholecystectomy. No biliary dilatation. Pancreas: Unremarkable. No pancreatic ductal dilatation or surrounding inflammatory changes. Spleen: Normal in size without focal abnormality. Adrenals/Urinary Tract: Left kidney cysts measuring up to 3.1 cm. Bilateral kidney stones the largest measuring 10 mm in lower pole of right kidney. No ureter stone or hydronephrosis. Normal adrenal glands. Normal bladder. Stomach/Bowel: To the left and inferior of the umbilicus is a small hernia with 4 cm neck containing a loop of transverse colon without proximal obstruction. Right lateral to the umbilicus is a large hernia with 4.3 cm neck intending the cecum and terminal ileum. Upstream small bowel is dilated with fluid levels indicating obstruction. The stomach is also distended. There is mild edema and inflammation within the large hernia sac. Pancolonic diverticulosis without findings of acute diverticulitis. Vascular/Lymphatic: Aortic atherosclerosis. No enlarged abdominal or pelvic lymph nodes. Reproductive: Uterus and bilateral adnexa are unremarkable. Other: No ascites. Musculoskeletal: No fracture is seen. IMPRESSION: 1. Small bowel obstruction secondary to large hernia to the right of the umbilicus containing  cecum and terminal ileum. 2. Small hernia to the left and inferior of umbilicus containing loop of transverse colon without obstruction. 3. Bilateral kidney stones. No hydronephrosis or ureter stone. 4. Pancolonic diverticulosis without findings of acute diverticulitis. 5. Aortic Atherosclerosis (ICD10-I70.0). Electronically Signed   By: Kristine Garbe M.D.   On: 12/20/2018 22:33   Dg Abd 1 View  Result Date: 12/20/2018 CLINICAL DATA:  Check gastric catheter placement EXAM: ABDOMEN - 1 VIEW COMPARISON:  12/20/2018 FINDINGS: Gastric catheter is noted within the stomach. Cardiac shadows within normal limits. The lungs are clear with the exception of minimal left basilar atelectasis. Stable right renal stone. IMPRESSION: Gastric catheter within the stomach. Electronically Signed   By: Inez Catalina M.D.   On: 12/20/2018 23:48   Dg Abdomen Acute W/chest  Result Date: 12/20/2018 CLINICAL DATA:  85 year old female with a history of right lower quadrant pain EXAM: DG ABDOMEN ACUTE W/ 1V CHEST COMPARISON:  None. FINDINGS: Chest: Cardiomediastinal silhouette within normal limits. No evidence of central vascular congestion. No pneumothorax or pleural effusion. No confluent airspace disease. Abdomen: Portions of the abdomen have been excluded given the patient's body habitus. Partially distended small bowel loops. Relative paucity of gas within the mid abdomen. Calcifications projecting over the right renal silhouette. Surgical changes of the pelvis. No displaced fracture. IMPRESSION: Chest: No radiographic evidence of acute cardiopulmonary disease. Abdomen: Limited plain film abdomen demonstrates borderline distended small bowel loops, with nonspecific bowel gas pattern. If there is concern for acute intra-abdominal process, correlation with CT may be indicated. Questionable right-sided nephrolithiasis. Electronically Signed   By: Corrie Mckusick D.O.   On: 12/20/2018 20:25    Procedures Procedures  (including critical care time)  Medications Ordered in ED Medications  sodium chloride 0.9 % bolus 1,000 mL (0 mLs Intravenous Stopped 12/20/18 2233)  sodium chloride 0.9 % bolus 1,000 mL (0 mLs Intravenous Stopped 12/20/18 2114)  ondansetron (ZOFRAN) injection 4 mg (4 mg Intravenous Given 12/20/18 1950)  fentaNYL (SUBLIMAZE) injection 50 mcg (50 mcg Intravenous Given 12/20/18 1950)  piperacillin-tazobactam (ZOSYN) IVPB 3.375 g (0 g Intravenous Stopped 12/20/18 2329)  0.9 %  sodium chloride infusion ( Intravenous New Bag/Given 12/20/18 2238)     Initial Impression / Assessment and Plan / ED Course  I have reviewed the triage vital signs and the nursing notes.  Pertinent labs & imaging results that were available during my care of the patient were reviewed by me and considered in my medical decision making (see chart for details).        85 year old female here with small bowel obstruction secondary to incarcerated ventral hernia.  No signs of perforation or strangulation.  Lab work shows AKI and mild lactic acidosis due to likely dehydration.  She is initially given antibiotics but I see no signs of infection.  Will continue fluid resuscitation, NG tube, admit to medicine.  Dr. Barry Dienes has seen in the ED as well with general surgery.  Final Clinical Impressions(s) / ED Diagnoses   Final diagnoses:  Small bowel obstruction [facility])    ED Discharge Orders    None       Duffy Bruce, MD 12/21/18 0010

## 2018-12-20 NOTE — ED Notes (Signed)
Dr. Byerly at bedside.  

## 2018-12-20 NOTE — ED Triage Notes (Signed)
Pt BIB [facility] from home, c/o nausea/vomiting and abdominal pain since Sunday. Hx hernia states that it has been more obvious since Sunday as well. EMS noted orthostatic changes. Given 500ccNS and 4mg  zofran PTA. EMS vitals: HR 124, RR 14, SpO2 94%

## 2018-12-20 NOTE — ED Notes (Signed)
Per lab light green tube is hemolyzed.  New orders placed

## 2018-12-20 NOTE — Consult Note (Signed)
Reason for Consult:Small bowel obstruction secondary to ventral hernia Referring Physician: Ellender Hose, MD  Felicia Stephens is an 85 y.o. female.  HPI:  Felicia Stephens is an 39 yo Felicia Stephens who presented to the ED with acute onset of abdominal pain, nausea, and vomiting.  This started 3 days ago and came on gradually. Felicia Stephens has a large hernia at baseline, but Felicia Stephens has never had an obstruction.  Felicia Stephens states that if Felicia Stephens eats something that makes a lot of gas that the hernia will swell up a bit, but usually will then come down over a few days.  This time that did not happen.  Felicia Stephens had a large meal Sunday and had progressive issues since then.   Felicia Stephens has a surgical history with at least a lap chole, and hernia at the umbilical incision, a recurrent hernia, and an inguinal/femoral hernia. Felicia Stephens does have mesh.   Her last BM and flatus was Sunday or monday.  Nothing that Felicia Stephens did at home helped.    Felicia Stephens lives with family.  Felicia Stephens gets around well and is quite sharp.    Past Medical History:  Diagnosis Date  . Hyperlipidemia   . Hypertension     Past Surgical History:  Procedure Laterality Date  . ABDOMINAL SURGERY      Family History  Problem Relation Age of Onset  . Breast cancer Sister     Social History:  reports that Felicia Stephens has never smoked. Felicia Stephens has never used smokeless tobacco. Felicia Stephens reports that Felicia Stephens does not drink alcohol or use drugs.  Allergies: No Known Allergies  Medications:  Claritin lipitor HCTZ Occasional aleve MVT  Results for orders placed or performed during the hospital encounter of 12/20/18 (from the past 48 hour(s))  CBC with Differential     Status: Abnormal   Collection Time: 12/20/18  7:39 PM  Result Value Ref Range   WBC 13.1 (H) 4.0 - 10.5 K/uL   RBC 5.18 (H) 3.87 - 5.11 MIL/uL   Hemoglobin 16.2 (H) 12.0 - 15.0 g/dL   HCT 47.7 (H) 36.0 - 46.0 %   MCV 92.1 80.0 - 100.0 fL   MCH 31.3 26.0 - 34.0 pg   MCHC 34.0 30.0 - 36.0 g/dL   RDW 12.7 11.5 - 15.5 %   Platelets 310 150 - 400 K/uL   nRBC 0.0  0.0 - 0.2 %   Neutrophils Relative % 81 %   Neutro Abs 10.6 (H) 1.7 - 7.7 K/uL   Lymphocytes Relative 6 %   Lymphs Abs 0.8 0.7 - 4.0 K/uL   Monocytes Relative 12 %   Monocytes Absolute 1.6 (H) 0.1 - 1.0 K/uL   Eosinophils Relative 0 %   Eosinophils Absolute 0.0 0.0 - 0.5 K/uL   Basophils Relative 0 %   Basophils Absolute 0.0 0.0 - 0.1 K/uL   WBC Morphology See Note     Comment: Increased Bands. >20% Bands  Vaculated Neutrophils    nRBC 0 0 /100 WBC   Metamyelocytes Relative 1 %   Abs Immature Granulocytes 0.10 (H) 0.00 - 0.07 K/uL    Comment: Performed at [facility] [address], [place], [place] [address]  Lactic acid, plasma     Status: Abnormal   Collection Time: 12/20/18  8:39 PM  Result Value Ref Range   Lactic Acid, Venous 2.3 (HH) 0.5 - 1.9 mmol/L    Comment: CRITICAL RESULT CALLED TO, READ BACK BY AND VERIFIED WITH: K [facility] 2131 12/20/2018 WBOND Performed at [facility]ospital Lab, [place] [address] [place], [place] [address]   Lipase, blood     Status: None   Collection Time: 12/20/18  8:39 PM  Result Value Ref Range   Lipase 23 11 - 51 U/L    Comment: Performed at [facility] [address], [place], [place] [address]  Comprehensive metabolic panel     Status: Abnormal   Collection Time: 12/20/18  8:39 PM  Result Value Ref Range   Sodium 130 (L) 135 - 145 mmol/L   Potassium 3.4 (L) 3.5 - 5.1 mmol/L   Chloride 94 (L) 98 - 111 mmol/L   CO2 23 22 - 32 mmol/L   Glucose, Bld 128 (H) 70 - 99 mg/dL   BUN 50 (H) 8 - 23 mg/dL   Creatinine, Ser 2.24 (H) 0.44 - 1.00 mg/dL   Calcium 10.2 8.9 - 10.3 mg/dL   Total Protein 6.9 6.5 - 8.1 g/dL   Albumin 3.1 (L) 3.5 - 5.0 g/dL   AST 22 15 - 41 U/L   ALT 12 0 - 44 U/L   Alkaline Phosphatase 80 38 - 126 U/L   Total Bilirubin 3.5 (H) 0.3 - 1.2 mg/dL   GFR calc non Af Amer 20 (L) >60 mL/min   GFR calc Af Amer 23 (L) >60 mL/min   Anion gap 13 5 - 15    Comment: Performed at [facility], [place]  [address], [place], [place] [address]    Ct Abdomen Pelvis Wo Contrast  Result Date: 12/20/2018 CLINICAL DATA:  89 y/o  Felicia Stephens; abdominal distention, nausea, vomiting. EXAM: CT ABDOMEN AND PELVIS WITHOUT CONTRAST TECHNIQUE: Multidetector CT imaging of the abdomen and pelvis was performed following the standard protocol without IV contrast. COMPARISON:  None. FINDINGS: Lower chest: No acute abnormality. Hepatobiliary: No focal liver abnormality is seen. Status post cholecystectomy. No biliary dilatation. Pancreas: Unremarkable. No pancreatic ductal dilatation or surrounding inflammatory changes. Spleen: Normal in size without focal abnormality. Adrenals/Urinary Tract: Left kidney cysts measuring up to 3.1 cm. Bilateral kidney stones the largest measuring 10 mm in lower pole of right kidney. No ureter stone or hydronephrosis. Normal adrenal glands. Normal bladder. Stomach/Bowel: To the left and inferior of the umbilicus is a small hernia with 4 cm neck containing a loop of transverse colon without proximal obstruction. Right lateral to the umbilicus is a large hernia with 4.3 cm neck intending the cecum and terminal ileum. Upstream small bowel is dilated with fluid levels indicating obstruction. The stomach is also distended. There is mild edema and inflammation within the large hernia sac. Pancolonic diverticulosis without findings of acute diverticulitis. Vascular/Lymphatic: Aortic atherosclerosis. No enlarged abdominal or pelvic lymph nodes. Reproductive: Uterus and bilateral adnexa are unremarkable. Other: No ascites. Musculoskeletal: No fracture is seen. IMPRESSION: 1. Small bowel obstruction secondary to large hernia to the right of the umbilicus containing cecum and terminal ileum. 2. Small hernia to the left and inferior of umbilicus containing loop of transverse colon without obstruction. 3. Bilateral kidney stones. No hydronephrosis or ureter stone. 4. Pancolonic diverticulosis without findings of acute  diverticulitis. 5. Aortic Atherosclerosis (ICD10-I70.0). Electronically Signed   By: Kristine Garbe M.D.   On: 12/20/2018 22:33   Dg Abdomen Acute W/chest  Result Date: 12/20/2018 CLINICAL DATA:  85 year old female with a history of right lower quadrant pain EXAM: DG ABDOMEN ACUTE W/ 1V CHEST COMPARISON:  None. FINDINGS: Chest: Cardiomediastinal silhouette within normal limits. No evidence of central vascular congestion. No pneumothorax or pleural effusion.  No confluent airspace disease. Abdomen: Portions of the abdomen have been excluded given the patient's body habitus. Partially distended small bowel loops. Relative paucity of gas within the mid abdomen. Calcifications projecting over the right renal silhouette. Surgical changes of the pelvis. No displaced fracture. IMPRESSION: Chest: No radiographic evidence of acute cardiopulmonary disease. Abdomen: Limited plain film abdomen demonstrates borderline distended small bowel loops, with nonspecific bowel gas pattern. If there is concern for acute intra-abdominal process, correlation with CT may be indicated. Questionable right-sided nephrolithiasis. Electronically Signed   By: Corrie Mckusick D.O.   On: 12/20/2018 20:25    Review of Systems  Constitutional: Negative.   HENT: Negative.   Eyes: Negative.   Respiratory: Negative.   Cardiovascular: Negative.   Gastrointestinal: Positive for abdominal pain, nausea and vomiting.       Hernia  Genitourinary:       Concentrated urine.    Musculoskeletal: Negative.   Skin: Negative.   Neurological: Negative.   Endo/Heme/Allergies: Negative.   Psychiatric/Behavioral: Negative.     Blood pressure (!) 120/42, pulse (!) 106, temperature 98 Felicia Stephens (36.7 C), temperature source Temporal, resp. rate (!) 26, SpO2 96 %.   Physical Exam  Constitutional: Felicia Stephens is oriented to person, place, and time. Felicia Stephens appears well-developed and well-nourished. Felicia Stephens appears distressed (looks uncomfortable).  HENT:  Head:  Normocephalic and atraumatic.  Right Ear: External ear normal.  Left Ear: External ear normal.  Eyes: Pupils are equal, round, and reactive to light. Conjunctivae are normal.  Neck: Normal range of motion. Neck supple.  Cardiovascular: Regular rhythm and intact distal pulses.  tachycardic  Respiratory: Effort normal. No respiratory distress. Felicia Stephens exhibits no tenderness.  GI: Soft. Felicia Stephens exhibits distension. There is no rebound and no guarding.  RLQ ventral hernia that starts just right of umbilicus. Tender at hernia.  Non tender remaining abdomen.  Transverse incision just over umbilicus.    Musculoskeletal:        General: No deformity or edema.  Neurological: Felicia Stephens is alert and oriented to person, place, and time. Coordination normal.  Skin: Skin is warm and dry. No rash noted. Felicia Stephens is not diaphoretic. No erythema. No pallor.  Psychiatric: Felicia Stephens has a normal mood and affect. Her behavior is normal. Judgment and thought content normal.    Assessment/Plan:  Small bowel obstruction secondary to incarcerated ventral incisional hernia. Acute kidney injury Hyperbilirubinemia Hyponatremia Hypochloremia   Rehydrate Decompress with NGT. I am not planning on starting small bowel protocol at this point as I am concerned that Felicia Stephens will need surgical reduction of hernia regardless of obstruction.  Currently does not have signs of strangulation, but lower threshold for surgery than general adhesive bowel obstruction.    Felicia Stephens may have baseline hernia incarceration, without obstruction, so complete reduction of hernia may not be possible.      Stark Klein 12/20/2018, 10:59 PM

## 2018-12-20 NOTE — ED Notes (Signed)
Pt has had no vomiting since zofran administration, hold NG tube insertion per Dr. Ellender Hose.

## 2018-12-20 NOTE — ED Notes (Signed)
Patient transported to CT 

## 2018-12-21 ENCOUNTER — Inpatient Hospital Stay (HOSPITAL_COMMUNITY): Payer: Medicare Other

## 2018-12-21 DIAGNOSIS — K430 Incisional hernia with obstruction, without gangrene: Secondary | ICD-10-CM | POA: Diagnosis present

## 2018-12-21 DIAGNOSIS — D62 Acute posthemorrhagic anemia: Secondary | ICD-10-CM | POA: Diagnosis not present

## 2018-12-21 DIAGNOSIS — K56609 Unspecified intestinal obstruction, unspecified as to partial versus complete obstruction: Secondary | ICD-10-CM

## 2018-12-21 DIAGNOSIS — Z7951 Long term (current) use of inhaled steroids: Secondary | ICD-10-CM | POA: Diagnosis not present

## 2018-12-21 DIAGNOSIS — K436 Other and unspecified ventral hernia with obstruction, without gangrene: Secondary | ICD-10-CM | POA: Diagnosis not present

## 2018-12-21 DIAGNOSIS — E878 Other disorders of electrolyte and fluid balance, not elsewhere classified: Secondary | ICD-10-CM | POA: Diagnosis present

## 2018-12-21 DIAGNOSIS — E876 Hypokalemia: Secondary | ICD-10-CM | POA: Diagnosis present

## 2018-12-21 DIAGNOSIS — R112 Nausea with vomiting, unspecified: Secondary | ICD-10-CM | POA: Diagnosis present

## 2018-12-21 DIAGNOSIS — N179 Acute kidney failure, unspecified: Secondary | ICD-10-CM | POA: Diagnosis present

## 2018-12-21 DIAGNOSIS — G9341 Metabolic encephalopathy: Secondary | ICD-10-CM | POA: Diagnosis present

## 2018-12-21 DIAGNOSIS — Z978 Presence of other specified devices: Secondary | ICD-10-CM | POA: Diagnosis not present

## 2018-12-21 DIAGNOSIS — K559 Vascular disorder of intestine, unspecified: Secondary | ICD-10-CM | POA: Diagnosis present

## 2018-12-21 DIAGNOSIS — E860 Dehydration: Secondary | ICD-10-CM | POA: Diagnosis present

## 2018-12-21 DIAGNOSIS — Z72820 Sleep deprivation: Secondary | ICD-10-CM | POA: Diagnosis not present

## 2018-12-21 DIAGNOSIS — Z79899 Other long term (current) drug therapy: Secondary | ICD-10-CM | POA: Diagnosis not present

## 2018-12-21 DIAGNOSIS — E785 Hyperlipidemia, unspecified: Secondary | ICD-10-CM | POA: Diagnosis present

## 2018-12-21 DIAGNOSIS — E871 Hypo-osmolality and hyponatremia: Secondary | ICD-10-CM | POA: Diagnosis present

## 2018-12-21 DIAGNOSIS — I10 Essential (primary) hypertension: Secondary | ICD-10-CM | POA: Diagnosis present

## 2018-12-21 DIAGNOSIS — H9190 Unspecified hearing loss, unspecified ear: Secondary | ICD-10-CM | POA: Diagnosis present

## 2018-12-21 DIAGNOSIS — E872 Acidosis: Secondary | ICD-10-CM | POA: Diagnosis present

## 2018-12-21 LAB — CBC
HCT: 40.6 % (ref 36.0–46.0)
Hemoglobin: 14 g/dL (ref 12.0–15.0)
MCH: 31.3 pg (ref 26.0–34.0)
MCHC: 34.5 g/dL (ref 30.0–36.0)
MCV: 90.8 fL (ref 80.0–100.0)
Platelets: 227 10*3/uL (ref 150–400)
RBC: 4.47 MIL/uL (ref 3.87–5.11)
RDW: 12.5 % (ref 11.5–15.5)
WBC: 10.4 10*3/uL (ref 4.0–10.5)
nRBC: 0 % (ref 0.0–0.2)

## 2018-12-21 LAB — BASIC METABOLIC PANEL
Anion gap: 11 (ref 5–15)
BUN: 57 mg/dL — ABNORMAL HIGH (ref 8–23)
CO2: 22 mmol/L (ref 22–32)
Calcium: 9.7 mg/dL (ref 8.9–10.3)
Chloride: 98 mmol/L (ref 98–111)
Creatinine, Ser: 2.05 mg/dL — ABNORMAL HIGH (ref 0.44–1.00)
GFR calc Af Amer: 25 mL/min — ABNORMAL LOW (ref >60)
GFR calc non Af Amer: 22 mL/min — ABNORMAL LOW (ref >60)
Glucose, Bld: 130 mg/dL — ABNORMAL HIGH (ref 70–99)
Potassium: 3.4 mmol/L — ABNORMAL LOW (ref 3.5–5.1)
Sodium: 131 mmol/L — ABNORMAL LOW (ref 135–145)

## 2018-12-21 MED ORDER — PHENOL 1.4 % MT LIQD: 1.0000 | OROMUCOSAL | Status: DC | PRN

## 2018-12-21 MED ORDER — KCL IN DEXTROSE-NACL 10-5-0.45 MEQ/L-%-% IV SOLN
INTRAVENOUS | Status: DC
  Administered 2018-12-21 – 2018-12-25 (×7): via INTRAVENOUS
  Filled 2018-12-21 (×10): qty 1000

## 2018-12-21 MED ORDER — HYDRALAZINE HCL 20 MG/ML IJ SOLN: 10.0000 mg | Freq: Four times a day (QID) | INTRAMUSCULAR | Status: DC | PRN

## 2018-12-21 MED ORDER — SODIUM CHLORIDE 0.9 % IV SOLN
INTRAVENOUS | Status: DC
  Administered 2018-12-21: 02:00:00 via INTRAVENOUS

## 2018-12-21 MED ORDER — FENTANYL CITRATE (PF) 100 MCG/2ML IJ SOLN: 25.0000 ug | INTRAMUSCULAR | Status: DC | PRN

## 2018-12-21 MED ORDER — ONDANSETRON HCL 4 MG/2ML IJ SOLN: 4.0000 mg | Freq: Four times a day (QID) | INTRAMUSCULAR | Status: DC | PRN

## 2018-12-21 MED ORDER — WHITE PETROLATUM EX OINT
TOPICAL_OINTMENT | CUTANEOUS | Status: AC
  Administered 2018-12-21: 03:00:00
  Filled 2018-12-21: qty 28.35

## 2018-12-21 MED ORDER — ONDANSETRON HCL 4 MG PO TABS: 4.0000 mg | ORAL_TABLET | Freq: Four times a day (QID) | ORAL | Status: DC | PRN

## 2018-12-21 MED ORDER — FLUTICASONE PROPIONATE 50 MCG/ACT NA SUSP
1.0000 | Freq: Every day | NASAL | Status: DC
  Administered 2018-12-23 – 2018-12-28 (×4): 1 via NASAL
  Filled 2018-12-21 (×2): qty 16

## 2018-12-21 MED ORDER — ACETAMINOPHEN 650 MG RE SUPP: 650.0000 mg | Freq: Four times a day (QID) | RECTAL | Status: DC | PRN

## 2018-12-21 MED ORDER — POTASSIUM CHLORIDE 10 MEQ/100ML IV SOLN
10.0000 meq | INTRAVENOUS | Status: AC
  Administered 2018-12-21 (×4): 10 meq via INTRAVENOUS
  Filled 2018-12-21 (×4): qty 100

## 2018-12-21 MED ORDER — ACETAMINOPHEN 325 MG PO TABS: 650.0000 mg | ORAL_TABLET | Freq: Four times a day (QID) | ORAL | Status: DC | PRN

## 2018-12-21 NOTE — H&P (Signed)
History and Physical    Felicia Stephens:[id number] DOB: 1932-09-08 DOA: 12/20/2018  PCP: Lajean Manes, MD  Patient coming from: Home  I have personally briefly reviewed patient's old medical records in [place]  Chief Complaint: N/V abd pain  HPI: Felicia Stephens is a 85 y.o. female with medical history significant of HTN, HLD.  Patient presents to the ED with onset of N/V and abd pain.  Started 3 days ago.  Has known large ventral hernia at baseline but never had SBO previously.  Large meal Sunday and progressive issues since then.   ED Course: CT reveals ventral hernia causing SBO.  Gen surg consulted and hospitalist asked to admit.   Review of Systems: As per HPI otherwise 10 point review of systems negative.   Past Medical History:  Diagnosis Date   Hyperlipidemia    Hypertension     Past Surgical History:  Procedure Laterality Date   ABDOMINAL SURGERY       reports that she has never smoked. She has never used smokeless tobacco. She reports that she does not drink alcohol or use drugs.  No Known Allergies  Family History  Problem Relation Age of Onset   Breast cancer Sister      Prior to Admission medications   Medication Sig Start Date End Date Taking? Authorizing Provider  atorvastatin (LIPITOR) 10 MG tablet Take 10 mg by mouth daily.    [provider]  benazepril-hydrochlorthiazide (LOTENSIN HCT) 10-12.5 MG per tablet Take 1 tablet by mouth daily.    [provider]  cephALEXin (KEFLEX) 500 MG capsule Take 1 capsule (500 mg total) by mouth 3 (three) times daily. 07/27/13   Harriet Masson, DPM  fluticasone (FLONASE) 50 MCG/ACT nasal spray Place into both nostrils daily.    [provider]  loratadine (CLARITIN) 10 MG tablet Take 10 mg by mouth daily.    [provider]  Multiple Vitamin (MULTIVITAMIN) tablet Take 1 tablet by mouth daily.    [provider]    Physical Exam: Vitals:   12/20/18  2130 12/20/18 2200 12/20/18 2330 12/21/18 0040  BP: (!) 112/49 (!) 120/42 (!) 100/50 (!) 92/46  Pulse: (!) 108 (!) 106 (!) 109 (!) 109  Resp: (!) 22 (!) 26 20 16   Temp:      TempSrc:      SpO2:  96% 97% 98%    Constitutional: NAD, calm, comfortable by the time I am seeing her (presumably NGT has had effect on abd pain) Eyes: PERRL, lids and conjunctivae normal ENMT: Mucous membranes are moist. Posterior pharynx clear of any exudate or lesions.Normal dentition.  Neck: normal, supple, no masses, no thyromegaly Respiratory: clear to auscultation bilaterally, no wheezing, no crackles. Normal respiratory effort. No accessory muscle use.  Cardiovascular: Regular rate and rhythm, no murmurs / rubs / gallops. No extremity edema. 2+ pedal pulses. No carotid bruits.  Abdomen: Tender at large ventral hernia, distended. Musculoskeletal: no clubbing / cyanosis. No joint deformity upper and lower extremities. Good ROM, no contractures. Normal muscle tone.  Skin: no rashes, lesions, ulcers. No induration Neurologic: CN 2-12 grossly intact. Sensation intact, DTR normal. Strength 5/5 in all 4.  Psychiatric: Normal judgment and insight. Alert and oriented x 3. Normal mood.    Labs on Admission: I have personally reviewed following labs and imaging studies  CBC: Recent Labs  Lab 12/20/18 1939  WBC 13.1*  NEUTROABS 10.6*  HGB 16.2*  HCT 47.7*  MCV 92.1  PLT 310  Basic Metabolic Panel: Recent Labs  Lab 12/20/18 2039  NA 130*  K 3.4*  CL 94*  CO2 23  GLUCOSE 128*  BUN 50*  CREATININE 2.24*  CALCIUM 10.2   GFR: CrCl cannot be calculated (Unknown ideal weight.). Liver Function Tests: Recent Labs  Lab 12/20/18 2039  AST 22  ALT 12  ALKPHOS 80  BILITOT 3.5*  PROT 6.9  ALBUMIN 3.1*   Recent Labs  Lab 12/20/18 2039  LIPASE 23   No results for input(s): AMMONIA in the last 168 hours. Coagulation Profile: No results for input(s): INR, PROTIME in the last 168 hours. Cardiac  Enzymes: No results for input(s): CKTOTAL, CKMB, CKMBINDEX, TROPONINI in the last 168 hours. BNP (last 3 results) No results for input(s): PROBNP in the last 8760 hours. HbA1C: No results for input(s): HGBA1C in the last 72 hours. CBG: No results for input(s): GLUCAP in the last 168 hours. Lipid Profile: No results for input(s): CHOL, HDL, LDLCALC, TRIG, CHOLHDL, LDLDIRECT in the last 72 hours. Thyroid Function Tests: No results for input(s): TSH, T4TOTAL, FREET4, T3FREE, THYROIDAB in the last 72 hours. Anemia Panel: No results for input(s): VITAMINB12, FOLATE, FERRITIN, TIBC, IRON, RETICCTPCT in the last 72 hours. Urine analysis: No results found for: COLORURINE, APPEARANCEUR, [place], [place], GLUCOSEU, HGBUR, BILIRUBINUR, KETONESUR, PROTEINUR, UROBILINOGEN, NITRITE, LEUKOCYTESUR  Radiological Exams on Admission: Ct Abdomen Pelvis Wo Contrast  Result Date: 12/20/2018 CLINICAL DATA:  85 y/o  F; abdominal distention, nausea, vomiting. EXAM: CT ABDOMEN AND PELVIS WITHOUT CONTRAST TECHNIQUE: Multidetector CT imaging of the abdomen and pelvis was performed following the standard protocol without IV contrast. COMPARISON:  None. FINDINGS: Lower chest: No acute abnormality. Hepatobiliary: No focal liver abnormality is seen. Status post cholecystectomy. No biliary dilatation. Pancreas: Unremarkable. No pancreatic ductal dilatation or surrounding inflammatory changes. Spleen: Normal in size without focal abnormality. Adrenals/Urinary Tract: Left kidney cysts measuring up to 3.1 cm. Bilateral kidney stones the largest measuring 10 mm in lower pole of right kidney. No ureter stone or hydronephrosis. Normal adrenal glands. Normal bladder. Stomach/Bowel: To the left and inferior of the umbilicus is a small hernia with 4 cm neck containing a loop of transverse colon without proximal obstruction. Right lateral to the umbilicus is a large hernia with 4.3 cm neck intending the cecum and terminal ileum. Upstream  small bowel is dilated with fluid levels indicating obstruction. The stomach is also distended. There is mild edema and inflammation within the large hernia sac. Pancolonic diverticulosis without findings of acute diverticulitis. Vascular/Lymphatic: Aortic atherosclerosis. No enlarged abdominal or pelvic lymph nodes. Reproductive: Uterus and bilateral adnexa are unremarkable. Other: No ascites. Musculoskeletal: No fracture is seen. IMPRESSION: 1. Small bowel obstruction secondary to large hernia to the right of the umbilicus containing cecum and terminal ileum. 2. Small hernia to the left and inferior of umbilicus containing loop of transverse colon without obstruction. 3. Bilateral kidney stones. No hydronephrosis or ureter stone. 4. Pancolonic diverticulosis without findings of acute diverticulitis. 5. Aortic Atherosclerosis (ICD10-I70.0). Electronically Signed   By: Kristine Garbe M.D.   On: 12/20/2018 22:33   Dg Abd 1 View  Result Date: 12/20/2018 CLINICAL DATA:  Check gastric catheter placement EXAM: ABDOMEN - 1 VIEW COMPARISON:  12/20/2018 FINDINGS: Gastric catheter is noted within the stomach. Cardiac shadows within normal limits. The lungs are clear with the exception of minimal left basilar atelectasis. Stable right renal stone. IMPRESSION: Gastric catheter within the stomach. Electronically Signed   By: Inez Catalina M.D.   On: 12/20/2018 23:48  Dg Abdomen Acute W/chest  Result Date: 12/20/2018 CLINICAL DATA:  85 year old female with a history of right lower quadrant pain EXAM: DG ABDOMEN ACUTE W/ 1V CHEST COMPARISON:  None. FINDINGS: Chest: Cardiomediastinal silhouette within normal limits. No evidence of central vascular congestion. No pneumothorax or pleural effusion. No confluent airspace disease. Abdomen: Portions of the abdomen have been excluded given the patient's body habitus. Partially distended small bowel loops. Relative paucity of gas within the mid abdomen. Calcifications  projecting over the right renal silhouette. Surgical changes of the pelvis. No displaced fracture. IMPRESSION: Chest: No radiographic evidence of acute cardiopulmonary disease. Abdomen: Limited plain film abdomen demonstrates borderline distended small bowel loops, with nonspecific bowel gas pattern. If there is concern for acute intra-abdominal process, correlation with CT may be indicated. Questionable right-sided nephrolithiasis. Electronically Signed   By: Corrie Mckusick D.O.   On: 12/20/2018 20:25    EKG: Independently reviewed.  Assessment/Plan Principal Problem:   Ventral hernia with bowel obstruction Active Problems:   HTN (hypertension)    1. Ventral hernia with SBO - 1. Gen surg has seen patient, please see their note 2. NPO 3. NGT 4. No small bowel protocol, gen surg thinks she will "need surgical reduction of hernia regardless of obstruction". 5. IVF: 2L bolus in ED then NS at 100 6. zofran PRN nausea 7. Fentanyl PRN pain 2. HTN - 1. Will hold home lisinopril-HCTZ 1. Think shes dehydrated from 3 days of SBO 2. And has mild AKI anyhow 3. AKI - 1. Likely due to N/V / SBO for 3 days 2. IVF as above  DVT prophylaxis: SCDs - in case they decide to do surgery Code Status: Full Family Communication: No family in room Disposition Plan: Home after admit Consults called: Gen surg Admission status: Admit to inpatient  Severity of Illness: The appropriate patient status for this patient is INPATIENT. Inpatient status is judged to be reasonable and necessary in order to provide the required intensity of service to ensure the patient's safety. The patient's presenting symptoms, physical exam findings, and initial radiographic and laboratory data in the context of their chronic comorbidities is felt to place them at high risk for further clinical deterioration. Furthermore, it is not anticipated that the patient will be medically stable for discharge from the hospital within 2  midnights of admission. The following factors support the patient status of inpatient.   " The patient's presenting symptoms include N/V, abd pain. " The worrisome physical exam findings include incarcerated ventral hernia. " The initial radiographic and laboratory data are worrisome because of Ventral hernia causing SBO. " The chronic co-morbidities include HTN, HLD.   * I certify that at the point of admission it is my clinical judgment that the patient will require inpatient hospital care spanning beyond 2 midnights from the point of admission due to high intensity of service, high risk for further deterioration and high frequency of surveillance required.*    Livvy Spilman M. DO [facility]  How to contact the [facility] Attending or Consulting provider [place] or covering provider during after hours [place], for this patient?  1. Check the care team in [facility] and look for a) attending/consulting [facility] provider listed and b) the [facility] team listed 2. Log into [email or web address]  Amion Physician Scheduling and messaging for groups and whole hospitals  On call and physician scheduling software for group practices, residents, hospitalists and other medical providers for call, clinic, rotation and shift schedules. On[employer] is a hospital-wide system  for scheduling doctors and paging doctors on call. EasyPlot is for scientific plotting and data analysis.  [email or web address]  and use [place]'s universal password to access. If you do not have the password, please contact the hospital operator.  3. Locate the [facility] provider you are looking for under [facility] and page to a number that you can be directly reached. 4. If you still have difficulty reaching the provider, please page the [facility] (Director on Call) for the Hospitalists listed on amion for assistance.  12/21/2018, 1:40 AM

## 2018-12-21 NOTE — Progress Notes (Signed)
Patient seen and evaluated, chart reviewed, please see EMR for updated orders. Please see full H&P dictated by admitting physician Dr. Alcario Drought for same date of service.   1)Small bowel obstruction secondary to incarcerated ventral incisional hernia--- General surgery consult appreciated, continue NG tube, continue PRN antiemetics and pain medications, patient may need surgical intervention if any as not reducible  2)HTN--- hold benazepril/HCTZ due to kidney concerns,   may use IV Hydralazine 10 mg  Every 4 hours Prn for systolic blood pressure over 160 mmhg  3)AKI--creatinine is down to 2.0 from 2.2, suspect due to dehydration compounded by benazepril HCTZ use,... Avoid nephrotoxic agents, continue to hydrate  4)FEN--with NG tube in situ, risk for hypoglycemia, change IV fluids to dextrose solution with potassium replacement   Patient seen and evaluated, chart reviewed, please see EMR for updated orders. Please see full H&P dictated by admitting physician Dr. Alcario Drought for same date of service.

## 2018-12-21 NOTE — Progress Notes (Signed)
[facility]/Trauma Progress Note      Assessment/Plan  Acute kidney injury Hyperbilirubinemia Hyponatremia Hypochloremia  Small bowel obstruction secondary to incarcerated ventral incisional hernia. - NGT - concerned that she will need surgical reduction of hernia but will wait one day with NGT to see if this helps decompress her and allow [place] to reduce the hernia.  - Currently does not have signs of strangulation, but lower threshold for surgery than general adhesive bowel obstruction.   - encourage ambulation, okay to clamp NGT to walk and use restroom  - we will follow - NO purwick    FEN: NGT, NPO, IVF VTE: SCD's, okay for lovenox from our standpoint but will defer to medicine ID: Zosyn 04/15 once Follow up: TBD     LOS: 0 days    Subjective: CC: Hernia  Pt is not complaining of much abdominal pain unless palpated. I instructed the nurse to remove the purwick.   Objective: Vital signs in last 24 hours: Temp:  [98 F (36.7 C)-98.5 F (36.9 C)] 98.2 F (36.8 C) (04/16 0510) Pulse Rate:  [101-110] 101 (04/16 0510) Resp:  [16-29] 17 (04/16 0510) BP: (92-133)/(42-62) 104/62 (04/16 0510) SpO2:  [95 %-98 %] 97 % (04/16 0510) Last BM Date: 12/17/18  Intake/Output from previous day: 04/15 0701 - 04/16 0700 In: 455.9 [I.V.:455.9] Out: 2050 [Emesis/NG output:2050] Intake/Output this shift: No intake/output data recorded.  PE: Gen:  Alert, NAD, pleasant, cooperative Pulm:  Rate and effort normal Abd: Soft, ND, large hernia noted to RLQ with blanching erythema noted to lower abdomen. Unable to reduce hernia. TTP of hernia. nontender of remaining abdomen.  Skin: warm and dry   Anti-infectives: Anti-infectives (From admission, onward)   Start     Dose/Rate Route Frequency Ordered Stop   12/20/18 2130  piperacillin-tazobactam (ZOSYN) IVPB 3.375 g     3.375 g 100 mL/hr over 30 Minutes Intravenous  Once 12/20/18 2124 12/20/18 2329      Lab  Results:  Recent Labs    12/20/18 1939 12/21/18 0321  WBC 13.1* 10.4  HGB 16.2* 14.0  HCT 47.7* 40.6  PLT 310 227   BMET Recent Labs    12/20/18 2039 12/21/18 0321  NA 130* 131*  K 3.4* 3.4*  CL 94* 98  CO2 23 22  GLUCOSE 128* 130*  BUN 50* 57*  CREATININE 2.24* 2.05*  CALCIUM 10.2 9.7   PT/INR No results for input(s): LABPROT, INR in the last 72 hours. CMP     Component Value Date/Time   NA 131 (L) 12/21/2018 0321   K 3.4 (L) 12/21/2018 0321   CL 98 12/21/2018 0321   CO2 22 12/21/2018 0321   GLUCOSE 130 (H) 12/21/2018 0321   BUN 57 (H) 12/21/2018 0321   CREATININE 2.05 (H) 12/21/2018 0321   CALCIUM 9.7 12/21/2018 0321   PROT 6.9 12/20/2018 2039   ALBUMIN 3.1 (L) 12/20/2018 2039   AST 22 12/20/2018 2039   ALT 12 12/20/2018 2039   ALKPHOS 80 12/20/2018 2039   BILITOT 3.5 (H) 12/20/2018 2039   GFRNONAA 22 (L) 12/21/2018 0321   GFRAA 25 (L) 12/21/2018 0321   Lipase     Component Value Date/Time   LIPASE 23 12/20/2018 2039    Studies/Results: Ct Abdomen Pelvis Wo Contrast  Result Date: 12/20/2018 CLINICAL DATA:  85 y/o  F; abdominal distention, nausea, vomiting. EXAM: CT ABDOMEN AND PELVIS WITHOUT CONTRAST TECHNIQUE: Multidetector CT imaging of the abdomen and pelvis was performed following the standard protocol without  IV contrast. COMPARISON:  None. FINDINGS: Lower chest: No acute abnormality. Hepatobiliary: No focal liver abnormality is seen. Status post cholecystectomy. No biliary dilatation. Pancreas: Unremarkable. No pancreatic ductal dilatation or surrounding inflammatory changes. Spleen: Normal in size without focal abnormality. Adrenals/Urinary Tract: Left kidney cysts measuring up to 3.1 cm. Bilateral kidney stones the largest measuring 10 mm in lower pole of right kidney. No ureter stone or hydronephrosis. Normal adrenal glands. Normal bladder. Stomach/Bowel: To the left and inferior of the umbilicus is a small hernia with 4 cm neck containing a loop of  transverse colon without proximal obstruction. Right lateral to the umbilicus is a large hernia with 4.3 cm neck intending the cecum and terminal ileum. Upstream small bowel is dilated with fluid levels indicating obstruction. The stomach is also distended. There is mild edema and inflammation within the large hernia sac. Pancolonic diverticulosis without findings of acute diverticulitis. Vascular/Lymphatic: Aortic atherosclerosis. No enlarged abdominal or pelvic lymph nodes. Reproductive: Uterus and bilateral adnexa are unremarkable. Other: No ascites. Musculoskeletal: No fracture is seen. IMPRESSION: 1. Small bowel obstruction secondary to large hernia to the right of the umbilicus containing cecum and terminal ileum. 2. Small hernia to the left and inferior of umbilicus containing loop of transverse colon without obstruction. 3. Bilateral kidney stones. No hydronephrosis or ureter stone. 4. Pancolonic diverticulosis without findings of acute diverticulitis. 5. Aortic Atherosclerosis (ICD10-I70.0). Electronically Signed   By: Kristine Garbe M.D.   On: 12/20/2018 22:33   Dg Abd 1 View  Result Date: 12/20/2018 CLINICAL DATA:  Check gastric catheter placement EXAM: ABDOMEN - 1 VIEW COMPARISON:  12/20/2018 FINDINGS: Gastric catheter is noted within the stomach. Cardiac shadows within normal limits. The lungs are clear with the exception of minimal left basilar atelectasis. Stable right renal stone. IMPRESSION: Gastric catheter within the stomach. Electronically Signed   By: Inez Catalina M.D.   On: 12/20/2018 23:48   Dg Abdomen Acute W/chest  Result Date: 12/20/2018 CLINICAL DATA:  85 year old female with a history of right lower quadrant pain EXAM: DG ABDOMEN ACUTE W/ 1V CHEST COMPARISON:  None. FINDINGS: Chest: Cardiomediastinal silhouette within normal limits. No evidence of central vascular congestion. No pneumothorax or pleural effusion. No confluent airspace disease. Abdomen: Portions of the  abdomen have been excluded given the patient's body habitus. Partially distended small bowel loops. Relative paucity of gas within the mid abdomen. Calcifications projecting over the right renal silhouette. Surgical changes of the pelvis. No displaced fracture. IMPRESSION: Chest: No radiographic evidence of acute cardiopulmonary disease. Abdomen: Limited plain film abdomen demonstrates borderline distended small bowel loops, with nonspecific bowel gas pattern. If there is concern for acute intra-abdominal process, correlation with CT may be indicated. Questionable right-sided nephrolithiasis. Electronically Signed   By: Corrie Mckusick D.O.   On: 12/20/2018 20:25      Kalman Drape , [facility] 12/21/2018, 10:27 AM  Pager: [phone] Mon-Wed, Friday 7:00am-4:30pm Thurs 7am-11:30am  Consults: [phone]

## 2018-12-22 ENCOUNTER — Encounter (HOSPITAL_COMMUNITY)
Admission: EM | Disposition: A | Discharge status: Home Health | Payer: Self-pay | Source: Home / Self Care | Attending: Internal Medicine

## 2018-12-22 ENCOUNTER — Encounter (HOSPITAL_COMMUNITY): Payer: Self-pay | Admitting: *Deleted

## 2018-12-22 ENCOUNTER — Inpatient Hospital Stay (HOSPITAL_COMMUNITY): Payer: Medicare Other | Admitting: Anesthesiology

## 2018-12-22 DIAGNOSIS — N179 Acute kidney failure, unspecified: Secondary | ICD-10-CM

## 2018-12-22 DIAGNOSIS — E876 Hypokalemia: Secondary | ICD-10-CM

## 2018-12-22 DIAGNOSIS — E871 Hypo-osmolality and hyponatremia: Secondary | ICD-10-CM

## 2018-12-22 HISTORY — PX: LAPAROTOMY: SHX154

## 2018-12-22 HISTORY — PX: VENTRAL HERNIA REPAIR: SHX424

## 2018-12-22 HISTORY — PX: BOWEL RESECTION: SHX1257

## 2018-12-22 LAB — SURGICAL PCR SCREEN
MRSA, PCR: NEGATIVE
Staphylococcus aureus: POSITIVE — AB

## 2018-12-22 LAB — CBC
HCT: 38.6 % (ref 36.0–46.0)
Hemoglobin: 12.5 g/dL (ref 12.0–15.0)
MCH: 29.8 pg (ref 26.0–34.0)
MCHC: 32.4 g/dL (ref 30.0–36.0)
MCV: 92.1 fL (ref 80.0–100.0)
Platelets: 241 10*3/uL (ref 150–400)
RBC: 4.19 MIL/uL (ref 3.87–5.11)
RDW: 12.2 % (ref 11.5–15.5)
WBC: 9.9 10*3/uL (ref 4.0–10.5)
nRBC: 0 % (ref 0.0–0.2)

## 2018-12-22 LAB — BASIC METABOLIC PANEL
Anion gap: 6 (ref 5–15)
Anion gap: 11 (ref 5–15)
BUN: 52 mg/dL — ABNORMAL HIGH (ref 8–23)
BUN: 38 mg/dL — ABNORMAL HIGH (ref 8–23)
CO2: 25 mmol/L (ref 22–32)
CO2: 22 mmol/L (ref 22–32)
Calcium: 10.4 mg/dL — ABNORMAL HIGH (ref 8.9–10.3)
Calcium: 10.5 mg/dL — ABNORMAL HIGH (ref 8.9–10.3)
Chloride: 103 mmol/L (ref 98–111)
Chloride: 105 mmol/L (ref 98–111)
Creatinine, Ser: 1.09 mg/dL — ABNORMAL HIGH (ref 0.44–1.00)
Creatinine, Ser: 0.9 mg/dL (ref 0.44–1.00)
GFR calc Af Amer: 54 mL/min — ABNORMAL LOW (ref >60)
GFR calc Af Amer: >60 mL/min (ref >60)
GFR calc non Af Amer: 47 mL/min — ABNORMAL LOW (ref >60)
GFR calc non Af Amer: 59 mL/min — ABNORMAL LOW (ref >60)
Glucose, Bld: 108 mg/dL — ABNORMAL HIGH (ref 70–99)
Glucose, Bld: 107 mg/dL — ABNORMAL HIGH (ref 70–99)
Potassium: 3.1 mmol/L — ABNORMAL LOW (ref 3.5–5.1)
Potassium: 3.7 mmol/L (ref 3.5–5.1)
Sodium: 134 mmol/L — ABNORMAL LOW (ref 135–145)
Sodium: 138 mmol/L (ref 135–145)

## 2018-12-22 LAB — MAGNESIUM: Magnesium: 1.8 mg/dL (ref 1.7–2.4)

## 2018-12-22 SURGERY — LAPAROTOMY, EXPLORATORY
Anesthesia: General | Site: Abdomen

## 2018-12-22 MED ORDER — LACTATED RINGERS IV SOLN: INTRAVENOUS | Status: DC

## 2018-12-22 MED ORDER — FENTANYL CITRATE (PF) 250 MCG/5ML IJ SOLN
INTRAMUSCULAR | Status: DC | PRN
  Administered 2018-12-22 (×2): 25 ug via INTRAVENOUS
  Administered 2018-12-22 (×2): 50 ug via INTRAVENOUS
  Administered 2018-12-22: 100 ug via INTRAVENOUS

## 2018-12-22 MED ORDER — LIDOCAINE 2% (20 MG/ML) 5 ML SYRINGE
INTRAMUSCULAR | Status: DC | PRN
  Administered 2018-12-22: 100 mg via INTRAVENOUS

## 2018-12-22 MED ORDER — HYDROMORPHONE HCL 1 MG/ML IJ SOLN
0.2500 mg | INTRAMUSCULAR | Status: DC | PRN
  Administered 2018-12-22: 0.5 mg via INTRAVENOUS

## 2018-12-22 MED ORDER — LACTATED RINGERS IV SOLN
INTRAVENOUS | Status: DC | PRN
  Administered 2018-12-22: 10:00:00 via INTRAVENOUS

## 2018-12-22 MED ORDER — ONDANSETRON HCL 4 MG/2ML IJ SOLN
INTRAMUSCULAR | Status: DC | PRN
  Administered 2018-12-22: 4 mg via INTRAVENOUS

## 2018-12-22 MED ORDER — LACTATED RINGERS IV SOLN
INTRAVENOUS | Status: DC | PRN
  Administered 2018-12-22 (×2): via INTRAVENOUS

## 2018-12-22 MED ORDER — FENTANYL CITRATE (PF) 250 MCG/5ML IJ SOLN
INTRAMUSCULAR | Status: AC
  Filled 2018-12-22: qty 5

## 2018-12-22 MED ORDER — 0.9 % SODIUM CHLORIDE (POUR BTL) OPTIME
TOPICAL | Status: DC | PRN
  Administered 2018-12-22: 11:00:00 2000 mL
  Administered 2018-12-22 (×2): 1000 mL

## 2018-12-22 MED ORDER — SUCCINYLCHOLINE CHLORIDE 20 MG/ML IJ SOLN
INTRAMUSCULAR | Status: DC | PRN
  Administered 2018-12-22: 120 mg via INTRAVENOUS

## 2018-12-22 MED ORDER — SUGAMMADEX SODIUM 200 MG/2ML IV SOLN
INTRAVENOUS | Status: DC | PRN
  Administered 2018-12-22: 180 mg via INTRAVENOUS

## 2018-12-22 MED ORDER — ONDANSETRON HCL 4 MG/2ML IJ SOLN
4.0000 mg | Freq: Once | INTRAMUSCULAR | Status: AC
  Administered 2018-12-22: 4 mg via INTRAVENOUS

## 2018-12-22 MED ORDER — PROPOFOL 10 MG/ML IV BOLUS
INTRAVENOUS | Status: AC
  Filled 2018-12-22: qty 20

## 2018-12-22 MED ORDER — ROCURONIUM BROMIDE 50 MG/5ML IV SOSY
PREFILLED_SYRINGE | INTRAVENOUS | Status: AC
  Filled 2018-12-22: qty 5

## 2018-12-22 MED ORDER — PIPERACILLIN-TAZOBACTAM 3.375 G IVPB
3.3750 g | Freq: Three times a day (TID) | INTRAVENOUS | Status: DC
  Administered 2018-12-22 – 2018-12-28 (×18): 3.375 g via INTRAVENOUS
  Filled 2018-12-22 (×16): qty 50

## 2018-12-22 MED ORDER — CEFOTETAN DISODIUM-DEXTROSE 2-2.08 GM-%(50ML) IV SOLR
2.0000 g | Freq: Two times a day (BID) | INTRAVENOUS | Status: DC
  Administered 2018-12-22: 2 g via INTRAVENOUS
  Filled 2018-12-22: qty 50

## 2018-12-22 MED ORDER — ONDANSETRON HCL 4 MG/2ML IJ SOLN
4.0000 mg | Freq: Four times a day (QID) | INTRAMUSCULAR | Status: DC | PRN
  Administered 2018-12-22: 4 mg via INTRAVENOUS
  Filled 2018-12-22 (×2): qty 2

## 2018-12-22 MED ORDER — ENOXAPARIN SODIUM 40 MG/0.4ML ~~LOC~~ SOLN
40.0000 mg | SUBCUTANEOUS | Status: DC
  Administered 2018-12-23 – 2018-12-29 (×7): 40 mg via SUBCUTANEOUS
  Filled 2018-12-22 (×7): qty 0.4

## 2018-12-22 MED ORDER — ROCURONIUM BROMIDE 10 MG/ML (PF) SYRINGE
PREFILLED_SYRINGE | INTRAVENOUS | Status: DC | PRN
  Administered 2018-12-22: 50 mg via INTRAVENOUS

## 2018-12-22 MED ORDER — ONDANSETRON HCL 4 MG/2ML IJ SOLN
INTRAMUSCULAR | Status: AC
  Filled 2018-12-22: qty 2

## 2018-12-22 MED ORDER — PROMETHAZINE HCL 25 MG/ML IJ SOLN: 6.2500 mg | INTRAMUSCULAR | Status: DC | PRN

## 2018-12-22 MED ORDER — HYDROMORPHONE HCL 1 MG/ML IJ SOLN
1.0000 mg | INTRAMUSCULAR | Status: DC | PRN
  Administered 2018-12-22 – 2018-12-24 (×5): 1 mg via INTRAVENOUS
  Filled 2018-12-22 (×5): qty 1

## 2018-12-22 MED ORDER — PROPOFOL 10 MG/ML IV BOLUS
INTRAVENOUS | Status: DC | PRN
  Administered 2018-12-22: 120 mg via INTRAVENOUS

## 2018-12-22 MED ORDER — HYDROMORPHONE HCL 1 MG/ML IJ SOLN
INTRAMUSCULAR | Status: AC
  Filled 2018-12-22: qty 1

## 2018-12-22 MED ORDER — SUCCINYLCHOLINE CHLORIDE 200 MG/10ML IV SOSY
PREFILLED_SYRINGE | INTRAVENOUS | Status: AC
  Filled 2018-12-22: qty 20

## 2018-12-22 MED ORDER — LIDOCAINE 2% (20 MG/ML) 5 ML SYRINGE
INTRAMUSCULAR | Status: AC
  Filled 2018-12-22: qty 5

## 2018-12-22 MED ORDER — ONDANSETRON 4 MG PO TBDP: 4.0000 mg | ORAL_TABLET | Freq: Four times a day (QID) | ORAL | Status: DC | PRN

## 2018-12-22 MED ORDER — SODIUM CHLORIDE 0.9 % IV SOLN
INTRAVENOUS | Status: DC | PRN
  Administered 2018-12-22: 25 ug/min via INTRAVENOUS

## 2018-12-22 MED ORDER — MEPERIDINE HCL 50 MG/ML IJ SOLN: 6.2500 mg | INTRAMUSCULAR | Status: DC | PRN

## 2018-12-22 MED ORDER — PHENYLEPHRINE 40 MCG/ML (10ML) SYRINGE FOR IV PUSH (FOR BLOOD PRESSURE SUPPORT)
PREFILLED_SYRINGE | INTRAVENOUS | Status: DC | PRN
  Administered 2018-12-22: 80 ug via INTRAVENOUS
  Administered 2018-12-22: 120 ug via INTRAVENOUS

## 2018-12-22 MED ORDER — POTASSIUM CHLORIDE 10 MEQ/100ML IV SOLN
10.0000 meq | INTRAVENOUS | Status: AC
  Administered 2018-12-22 (×3): 10 meq via INTRAVENOUS
  Filled 2018-12-22 (×3): qty 100

## 2018-12-22 MED ORDER — POTASSIUM CHLORIDE 10 MEQ/100ML IV SOLN
10.0000 meq | INTRAVENOUS | Status: DC
  Administered 2018-12-22: 10 meq via INTRAVENOUS
  Filled 2018-12-22 (×2): qty 100

## 2018-12-22 SURGICAL SUPPLY — 57 items
BIOPATCH RED 1 DISK 7.0 (GAUZE/BANDAGES/DRESSINGS) ×1 IMPLANT
BIOPATCH RED 1IN DISK 7.0MM (GAUZE/BANDAGES/DRESSINGS) ×1
BLADE CLIPPER SURG (BLADE) ×2 IMPLANT
BNDG GAUZE ELAST 4 BULKY (GAUZE/BANDAGES/DRESSINGS) ×2 IMPLANT
CANISTER SUCT 3000ML PPV (MISCELLANEOUS) ×3 IMPLANT
CHLORAPREP W/TINT 26ML (MISCELLANEOUS) ×3 IMPLANT
COVER SURGICAL LIGHT HANDLE (MISCELLANEOUS) ×3 IMPLANT
DRAIN CHANNEL 19F RND (DRAIN) ×2 IMPLANT
DRAPE LAPAROSCOPIC ABDOMINAL (DRAPES) ×3 IMPLANT
DRAPE WARM FLUID 44X44 (DRAPE) ×3 IMPLANT
DRSG OPSITE POSTOP 4X10 (GAUZE/BANDAGES/DRESSINGS) IMPLANT
DRSG OPSITE POSTOP 4X8 (GAUZE/BANDAGES/DRESSINGS) IMPLANT
DRSG PAD ABDOMINAL 8X10 ST (GAUZE/BANDAGES/DRESSINGS) ×2 IMPLANT
ELECT BLADE 6.5 EXT (BLADE) IMPLANT
ELECT CAUTERY BLADE 6.4 (BLADE) ×3 IMPLANT
ELECT REM PT RETURN 9FT ADLT (ELECTROSURGICAL) ×3
ELECTRODE REM PT RTRN 9FT ADLT (ELECTROSURGICAL) ×1 IMPLANT
EVACUATOR SILICONE 100CC (DRAIN) ×2 IMPLANT
GAUZE SPONGE 4X4 12PLY STRL (GAUZE/BANDAGES/DRESSINGS) ×2 IMPLANT
GLOVE BIO SURGEON STRL SZ7 (GLOVE) ×2 IMPLANT
GLOVE BIO SURGEON STRL SZ7.5 (GLOVE) ×3 IMPLANT
GLOVE BIO SURGEON STRL SZ8 (GLOVE) ×2 IMPLANT
GLOVE BIOGEL PI IND STRL 7.0 (GLOVE) IMPLANT
GLOVE BIOGEL PI IND STRL 8 (GLOVE) ×1 IMPLANT
GLOVE BIOGEL PI INDICATOR 7.0 (GLOVE) ×4
GLOVE BIOGEL PI INDICATOR 8 (GLOVE) ×4
GOWN STRL REUS W/ TWL LRG LVL3 (GOWN DISPOSABLE) ×1 IMPLANT
GOWN STRL REUS W/ TWL XL LVL3 (GOWN DISPOSABLE) ×1 IMPLANT
GOWN STRL REUS W/TWL LRG LVL3 (GOWN DISPOSABLE) ×3
GOWN STRL REUS W/TWL XL LVL3 (GOWN DISPOSABLE) ×3
HANDLE SUCTION POOLE (INSTRUMENTS) IMPLANT
KIT BASIN OR (CUSTOM PROCEDURE TRAY) ×3 IMPLANT
KIT TURNOVER KIT B (KITS) ×3 IMPLANT
LIGASURE IMPACT 36 18CM CVD LR (INSTRUMENTS) ×2 IMPLANT
NS IRRIG 1000ML POUR BTL (IV SOLUTION) ×10 IMPLANT
PACK GENERAL/GYN (CUSTOM PROCEDURE TRAY) ×3 IMPLANT
PAD ARMBOARD 7.5X6 YLW CONV (MISCELLANEOUS) ×3 IMPLANT
PENCIL SMOKE EVACUATOR (MISCELLANEOUS) ×3 IMPLANT
RELOAD PROXIMATE 75MM BLUE (ENDOMECHANICALS) ×9 IMPLANT
RELOAD STAPLE 75 3.8 BLU REG (ENDOMECHANICALS) IMPLANT
SPECIMEN JAR LARGE (MISCELLANEOUS) ×2 IMPLANT
SPONGE LAP 18X18 RF (DISPOSABLE) ×2 IMPLANT
STAPLER PROXIMATE 75MM BLUE (STAPLE) ×2 IMPLANT
STAPLER VISISTAT 35W (STAPLE) ×3 IMPLANT
SUCTION POOLE HANDLE (INSTRUMENTS) ×3
SUCTION POOLE TIP (SUCTIONS) ×3 IMPLANT
SUT ETHILON 2 0 FS 18 (SUTURE) ×2 IMPLANT
SUT NOVA NAB GS-21 0 18 T12 DT (SUTURE) ×4 IMPLANT
SUT PDS AB 1 TP1 54 (SUTURE) ×6 IMPLANT
SUT SILK 2 0 SH CR/8 (SUTURE) ×3 IMPLANT
SUT SILK 2 0 TIES 10X30 (SUTURE) ×3 IMPLANT
SUT SILK 3 0 SH CR/8 (SUTURE) ×3 IMPLANT
SUT SILK 3 0 TIES 10X30 (SUTURE) ×3 IMPLANT
TAPE CLOTH SURG 6X10 WHT LF (GAUZE/BANDAGES/DRESSINGS) ×2 IMPLANT
TOWEL NATURAL 10PK STERILE (DISPOSABLE) ×3 IMPLANT
TRAY FOLEY MTR SLVR 16FR STAT (SET/KITS/TRAYS/PACK) ×3 IMPLANT
YANKAUER SUCT BULB TIP NO VENT (SUCTIONS) ×2 IMPLANT

## 2018-12-22 NOTE — Anesthesia Postprocedure Evaluation (Signed)
Anesthesia Post Note  Patient: Felicia Stephens  Procedure(s) Performed: EXPLORATORY LAPAROTOMY (N/A Abdomen) SMALL BOWEL RESECTION (N/A Abdomen) PRIMARY HERNIA REPAIR (N/A Abdomen)     Patient location during evaluation: PACU Anesthesia Type: General Level of consciousness: sedated and patient cooperative Pain management: pain level controlled Vital Signs Assessment: post-procedure vital signs reviewed and stable Respiratory status: spontaneous breathing Cardiovascular status: stable Anesthetic complications: no    Last Vitals:  Vitals:   12/22/18 1300 12/22/18 1324  BP: (!) 142/62 127/62  Pulse: 98 (!) 102  Resp: (!) 22 17  Temp: 36.4 C 36.4 C  SpO2: 96% 100%    Last Pain:  Vitals:   12/22/18 1324  TempSrc: Oral  PainSc:                  Nolon Nations

## 2018-12-22 NOTE — Progress Notes (Signed)
PROGRESS NOTE   Felicia Stephens  JJH:[id number]    DOB: [birth year]    DOA: 12/20/2018  PCP: Lajean Manes, MD   I have briefly reviewed patients previous medical records in [facility].  Brief Narrative:  85 year old female with PMH of HLD, HTN, large ventral incisional hernia presented to [facility] ED on 4/15 due to 3 days history of acute onset of abdominal pain, nausea and vomiting.  She was initially admitted for SBO due to large hernia.  Failed conservative management including bowel rest and NG tube.  On 4/17, general surgery concerned regarding incarcerated ventral hernia with possible strangulated bowel and emergently took her to the OR for exploratory laparotomy, possible small bowel resection and hernia repair.   Assessment & Plan:   Principal Problem:   Ventral hernia with bowel obstruction Active Problems:   HTN (hypertension)   Incarcerated ventral hernia with possible strangulated bowel: History as noted above.  Failed conservative measures including NPO, NG tube drainage, IV fluids.  CCS input appreciated and are concerned with bowel strangulation secondary to incarcerated hernia and emergently took her to the OR this morning.  Management per CCS.  Acute kidney injury: Due to GI losses and poor oral intake.  Resolved after IV fluids.  Follow BMP closely.  No hydronephrosis on CT abdomen.  Hypokalemia: Replace IV aggressively and follow BMP.  Magnesium 1.8.  Acute metabolic encephalopathy: Multifactorial due to acute medical illness including SBO, acute kidney injury complicating sleep deprivation, hospitalization etc.  Treat underlying cause.  Delirium precautions.  Minimize opioid use.  Sleep hygiene.  Monitor closely  Essential hypertension: Home lisinopril-HCTZ on hold.  Controlled.  Monitor closely.  Hyperlipidemia: Holding statins due to GI issues as above.  Dehydration with hyponatremia: Related to poor oral intake and GI losses from SBO.  Improving.  Continue normal  saline hydration.     DVT prophylaxis: SCDs Code Status: Full Family Communication: I discussed in detail with patient's daughter, updated care and answered questions. Disposition: To be determined pending surgery and clinical improvement.   Consultants:  General surgery  Procedures:  NG tube 4/16 >  Antimicrobials:  None.   Subjective: Patient seen this morning prior to procedure.  Appeared slightly confused.  Indicates that abdominal pain has improved but not resolved.  No BM for 4 to 5 days.  No flatus.  Denies chest pain, dyspnea.  As per RN at bedside, apart from confusion, no acute issues reported.  ROS: As above, otherwise negative.  Objective:  Vitals:   12/21/18 1458 12/21/18 2042 12/22/18 0520 12/22/18 0946  BP: (!) 130/58 (!) 123/58 137/71   Pulse: (!) 110 (!) 106 94   Resp: 16 17 16    Temp: 98.8 F (37.1 C) 98.2 F (36.8 C) 98.1 F (36.7 C)   TempSrc: Oral Oral Oral   SpO2: 99% 97% 99%   Weight:    72.6 kg  Height:    5' 3.5" (1.613 m)    Examination:  General exam: Pleasant elderly female, moderately built and nourished lying propped up in bed.  Oral mucosa with borderline hydration. Respiratory system: Clear to auscultation. Respiratory effort normal. Cardiovascular system: S1 & S2 heard, RRR. No JVD, murmurs, rubs, gallops or clicks. No pedal edema. Gastrointestinal system: Suprapubic/right lower quadrant area with mild distention with associated erythema/induration of superficial skin, associated tenderness and guarding.  Rest of abdomen nondistended, soft and nontender.  Bowel sounds present. Central nervous system: Alert and oriented to self and place. No focal neurological deficits.  Extremities: Symmetric 5 x 5 power. Skin: No rashes, lesions or ulcers Psychiatry: Judgement and insight appear somewhat impaired. Mood & affect appropriate.     Data Reviewed: I have personally reviewed following labs and imaging studies  CBC: Recent Labs   Lab 12/20/18 1939 12/21/18 0321 12/22/18 0118  WBC 13.1* 10.4 9.9  NEUTROABS 10.6*  --   --   HGB 16.2* 14.0 12.5  HCT 47.7* 40.6 38.6  MCV 92.1 90.8 92.1  PLT 310 227 814   Basic Metabolic Panel: Recent Labs  Lab 12/20/18 2039 12/21/18 0321 12/22/18 0118  NA 130* 131* 134*  K 3.4* 3.4* 3.1*  CL 94* 98 103  CO2 23 22 25   GLUCOSE 128* 130* 108*  BUN 50* 57* 52*  CREATININE 2.24* 2.05* 1.09*  CALCIUM 10.2 9.7 10.4*  MG  --   --  1.8   Liver Function Tests: Recent Labs  Lab 12/20/18 2039  AST 22  ALT 12  ALKPHOS 80  BILITOT 3.5*  PROT 6.9  ALBUMIN 3.1*     Recent Results (from the past 240 hour(s))  Blood culture (routine x 2)     Status: None (Preliminary result)   Collection Time: 12/20/18 10:36 PM  Result Value Ref Range Status   Specimen Description SITE NOT SPECIFIED  Final   Special Requests   Final    BOTTLES DRAWN AEROBIC AND ANAEROBIC Blood Culture results may not be optimal due to an inadequate volume of blood received in culture bottles   Culture   Final    NO GROWTH 2 DAYS Performed at [facility], [place] [address], [place], [place] [address]    Report Status PENDING  Incomplete  Blood culture (routine x 2)     Status: None (Preliminary result)   Collection Time: 12/20/18 10:38 PM  Result Value Ref Range Status   Specimen Description BLOOD LEFT ANTECUBITAL  Final   Special Requests   Final    BOTTLES DRAWN AEROBIC AND ANAEROBIC Blood Culture results may not be optimal due to an inadequate volume of blood received in culture bottles   Culture   Final    NO GROWTH 2 DAYS Performed at [facility], [place] [address], [place], [place] [address]    Report Status PENDING  Incomplete  Surgical pcr screen     Status: Abnormal   Collection Time: 12/22/18  8:46 AM  Result Value Ref Range Status   MRSA, PCR NEGATIVE NEGATIVE Final   Staphylococcus aureus POSITIVE (A) NEGATIVE Final    Comment: (NOTE) The Xpert SA Assay (FDA approved for  NASAL specimens in patients 74 years of age and older), is one component of a comprehensive surveillance program. It is not intended to diagnose infection nor to guide or monitor treatment. Performed at [facility], [place] [address] [place], [place] [address]          Radiology Studies: Ct Abdomen Pelvis Wo Contrast  Result Date: 12/20/2018 CLINICAL DATA:  85 y/o  F; abdominal distention, nausea, vomiting. EXAM: CT ABDOMEN AND PELVIS WITHOUT CONTRAST TECHNIQUE: Multidetector CT imaging of the abdomen and pelvis was performed following the standard protocol without IV contrast. COMPARISON:  None. FINDINGS: Lower chest: No acute abnormality. Hepatobiliary: No focal liver abnormality is seen. Status post cholecystectomy. No biliary dilatation. Pancreas: Unremarkable. No pancreatic ductal dilatation or surrounding inflammatory changes. Spleen: Normal in size without focal abnormality. Adrenals/Urinary Tract: Left kidney cysts measuring up to 3.1 cm. Bilateral kidney stones the  largest measuring 10 mm in lower pole of right kidney. No ureter stone or hydronephrosis. Normal adrenal glands. Normal bladder. Stomach/Bowel: To the left and inferior of the umbilicus is a small hernia with 4 cm neck containing a loop of transverse colon without proximal obstruction. Right lateral to the umbilicus is a large hernia with 4.3 cm neck intending the cecum and terminal ileum. Upstream small bowel is dilated with fluid levels indicating obstruction. The stomach is also distended. There is mild edema and inflammation within the large hernia sac. Pancolonic diverticulosis without findings of acute diverticulitis. Vascular/Lymphatic: Aortic atherosclerosis. No enlarged abdominal or pelvic lymph nodes. Reproductive: Uterus and bilateral adnexa are unremarkable. Other: No ascites. Musculoskeletal: No fracture is seen. IMPRESSION: 1. Small bowel obstruction secondary to large hernia to the right of the umbilicus  containing cecum and terminal ileum. 2. Small hernia to the left and inferior of umbilicus containing loop of transverse colon without obstruction. 3. Bilateral kidney stones. No hydronephrosis or ureter stone. 4. Pancolonic diverticulosis without findings of acute diverticulitis. 5. Aortic Atherosclerosis (ICD10-I70.0). Electronically Signed   By: Kristine Garbe M.D.   On: 12/20/2018 22:33   Dg Abd 1 View  Result Date: 12/20/2018 CLINICAL DATA:  Check gastric catheter placement EXAM: ABDOMEN - 1 VIEW COMPARISON:  12/20/2018 FINDINGS: Gastric catheter is noted within the stomach. Cardiac shadows within normal limits. The lungs are clear with the exception of minimal left basilar atelectasis. Stable right renal stone. IMPRESSION: Gastric catheter within the stomach. Electronically Signed   By: Inez Catalina M.D.   On: 12/20/2018 23:48   Dg Abdomen Acute W/chest  Result Date: 12/20/2018 CLINICAL DATA:  85 year old female with a history of right lower quadrant pain EXAM: DG ABDOMEN ACUTE W/ 1V CHEST COMPARISON:  None. FINDINGS: Chest: Cardiomediastinal silhouette within normal limits. No evidence of central vascular congestion. No pneumothorax or pleural effusion. No confluent airspace disease. Abdomen: Portions of the abdomen have been excluded given the patient's body habitus. Partially distended small bowel loops. Relative paucity of gas within the mid abdomen. Calcifications projecting over the right renal silhouette. Surgical changes of the pelvis. No displaced fracture. IMPRESSION: Chest: No radiographic evidence of acute cardiopulmonary disease. Abdomen: Limited plain film abdomen demonstrates borderline distended small bowel loops, with nonspecific bowel gas pattern. If there is concern for acute intra-abdominal process, correlation with CT may be indicated. Questionable right-sided nephrolithiasis. Electronically Signed   By: Corrie Mckusick D.O.   On: 12/20/2018 20:25   Dg Abd Portable  1v  Result Date: 12/21/2018 CLINICAL DATA:  NG position EXAM: PORTABLE ABDOMEN - 1 VIEW COMPARISON:  12/20/2018 FINDINGS: NG tube in the stomach with the tip in the body the stomach unchanged from yesterday. Dilated small bowel loops right upper quadrant consistent with small-bowel obstruction. IMPRESSION: NG tube remains in the body of the  stomach Small bowel remains dilated. Electronically Signed   By: Franchot Gallo M.D.   On: 12/21/2018 12:13        Scheduled Meds:  [MAR Hold] fluticasone  1 spray Each Nare Daily   Continuous Infusions:  cefoTEtan in Dextrose 5%     dextrose 5 % and 0.45 % NaCl with KCl 10 mEq/L 75 mL/hr at 12/21/18 2158   lactated ringers     [MAR Hold] potassium chloride 10 mEq (12/22/18 0920)   potassium chloride       LOS: 1 day     Vernell Leep, MD, FACP, [facility]. [facility]  To contact the attending provider between 7A-7P  or the covering provider during after hours 7P-7A, please log into the web site [email or web address] and access using universal [place] password for that web site. If you do not have the password, please call the hospital operator.  12/22/2018, 11:45 AM

## 2018-12-22 NOTE — Transfer of Care (Signed)
Immediate Anesthesia Transfer of Care Note  Patient: Felicia Stephens  Procedure(s) Performed: EXPLORATORY LAPAROTOMY (N/A Abdomen) SMALL BOWEL RESECTION (N/A Abdomen) PRIMARY HERNIA REPAIR (N/A Abdomen)  Patient Location: PACU  Anesthesia Type:General  Level of Consciousness: awake and alert   Airway & Oxygen Therapy: Patient Spontanous Breathing and Patient connected to face mask oxygen  Post-op Assessment: Report given to RN and Post -op Vital signs reviewed and stable  Post vital signs: Reviewed and stable  Last Vitals:  Vitals Value Taken Time  BP 158/69 12/22/2018 12:15 PM  Temp    Pulse 95 12/22/2018 12:17 PM  Resp 25 12/22/2018 12:17 PM  SpO2 98 % 12/22/2018 12:17 PM  Vitals shown include unvalidated device data.  Last Pain:  Vitals:   12/22/18 0946  TempSrc:   PainSc: 0-No pain         Complications: No apparent anesthesia complications

## 2018-12-22 NOTE — Op Note (Signed)
12/22/2018  11:54 AM  PATIENT:  Felicia Stephens  85 y.o. female  PRE-OPERATIVE DIAGNOSIS:  incarcerated ventral hernia  POST-OPERATIVE DIAGNOSIS:  incarcerated ventral hernia, ischemic bowel  PROCEDURE:  Procedure(s): EXPLORATORY LAPAROTOMY (N/A) SMALL BOWEL RESECTION (N/A) PRIMARY HERNIA REPAIR (N/A)  SURGEON:  Surgeon(s) and Role:    * Ralene Ok, MD - Primary    * Georganna Skeans, MD  ANESTHESIA:   local and general  EBL:  25cc   BLOOD ADMINISTERED:none  DRAINS: (19Fr) Jackson-Pratt drain(s) with closed bulb suction in the RLQ hernia sac   LOCAL MEDICATIONS USED:  NONE  SPECIMEN:  Source of Specimen:  Distal ileum  DISPOSITION OF SPECIMEN:  PATHOLOGY  COUNTS:  YES  TOURNIQUET:  * No tourniquets in log *  DICTATION: .Dragon Dictation Indication procedure: Patient is an 85 year old female who came in secondary to incarcerated ventral hernia.  Patient also had subsequent small bowel obstruction.  Patient progressed with abdominal pain, erythema to the abdominal wall secondary to the concern of ischemic bowel patient was taken to the operating room emergently for ex lap.  Details of procedure: After the patient was consented she was taken back to the OR and placed in the supine position with bilateral SCDs in place.  Patient underwent general trach intubation.  Patient was prepped and draped in standard fashion.  Timeout was called and all facts were verified.  A midline incision was made using #10 blade.  Dissection was taken down to the midline fascia.  The fascia was elevated between 2 Kocher's.  The peritoneum was then entered sharply.  At this time we proceeded to incise the fascia to the length of the skin incision.  Just superior to the umbilicus there was a large amount of omental as well as small bowel adhesions to the anterior abdominal wall.  These were taken down with sharp dissection carefully.  Once these adhesions were taken down it was evident there was  an incarcerated hernia to the right lower quadrant area.  The fascia was retracted and the small bowel was reduced carefully.  There appeared to be some ischemia to the small bowel.  This was incarcerated into a hernia sac within the hernia sac.  The bowel that was involved consisted of the small bowel as well as cecum and portion of the right colon.  Once the small bowel was completely removed from the hernia sac it can be evident that this was ischemic.  This was near the terminal ileum and involved the distal ileum.  At this time mesenteric window was made with proximally and distally to the area of ischemia.  18 GIA stapler was then used to transect this portion of the colon.  A LigaSure device was used to ligate the mesentery.  At this time the anastomosis was created after removing a corner of the each staple line.  A 75 GIA stapler was then used to create the anastomosis.  The staple lines were offset and the anastomosis was closed using a second firing GIA stapler.  The apex stitch was used to help reapproximate the apex.  The mesenteric defect was reapproximated using figure-of-eight 3 oh silks in interrupted fashion.  At this time the abdominal cavity was irrigated out with sterile saline.  The hernia on the right side had a large hernia sac and I do not feel that this could be removed.  Secondary to ischemia I decided to primarily reapproximate the hernia defect.  This was done using interrupted #1 Novafil's to the  midline area.  This was under no tension.  It should be noted that a 60 [place] Blake drain was placed within the hernia sac to help evacuate any seroma, possible future infection.  This was anchored to the abdominal wall using a 2-0 nylon x1.  There was another hernia sac there was to the left side of the lower incision.  The hernia sac was removed from this area as it was fairly shallow.  The omentum was brought over the midline incision site.  At this time the midline fascia was then  reapproximated using #1 PDS in a running fashion x2.  The skin was left open.  The skin was then packed with saline soaked Kerlix.  The wound was dressed with 4 x 4's, ABD pad, and tape.  The patient taught the procedure well was taken to the recovery in stable condition.  PLAN OF CARE: Admit to inpatient   PATIENT DISPOSITION:  PACU - hemodynamically stable.   Delay start of Pharmacological VTE agent (>24hrs) due to surgical blood loss or risk of bleeding: no

## 2018-12-22 NOTE — Anesthesia Preprocedure Evaluation (Addendum)
Anesthesia Evaluation  Patient identified by MRN, date of birth, ID band Patient awake    Reviewed: Allergy & Precautions, H&P , NPO status , Patient's Chart, lab work & pertinent test results, reviewed documented beta blocker date and time   Airway Mallampati: II  TM Distance: >3 FB Neck ROM: full    Dental no notable dental hx.    Pulmonary neg pulmonary ROS,    Pulmonary exam normal breath sounds clear to auscultation       Cardiovascular Exercise Tolerance: Good hypertension, Pt. on medications negative cardio ROS   Rhythm:regular Rate:Normal  EKG  Sinus tachycardia Right atrial enlargement Nonspecific intraventricular conduction delay Abnormal inferior Q waves   Neuro/Psych negative neurological ROS  negative psych ROS   GI/Hepatic negative GI ROS, Neg liver ROS,   Endo/Other  negative endocrine ROS  Renal/GU negative Renal ROS  negative genitourinary   Musculoskeletal   Abdominal   Peds  Hematology negative hematology ROS (+)   Anesthesia Other Findings   Reproductive/Obstetrics negative OB ROS                             Anesthesia Physical Anesthesia Plan  ASA: III and emergent  Anesthesia Plan: General   Post-op Pain Management:    Induction: Intravenous, Rapid sequence and Cricoid pressure planned  PONV Risk Score and Plan: 3 and Ondansetron and Treatment may vary due to age or medical condition  Airway Management Planned: Oral ETT  Additional Equipment:   Intra-op Plan:   Post-operative Plan: Extubation in OR  Informed Consent: I have reviewed the patients History and Physical, chart, labs and discussed the procedure including the risks, benefits and alternatives for the proposed anesthesia with the patient or authorized representative who has indicated his/her understanding and acceptance.     Dental Advisory Given  Plan Discussed with:  CRNA  Anesthesia Plan Comments: (  )       Anesthesia Quick Evaluation

## 2018-12-22 NOTE — Anesthesia Procedure Notes (Signed)
Procedure Name: Intubation Date/Time: 12/22/2018 10:30 AM Performed by: Bryson Corona, CRNA Pre-anesthesia Checklist: Patient identified, Emergency Drugs available, Suction available and Patient being monitored Patient Re-evaluated:Patient Re-evaluated prior to induction Oxygen Delivery Method: Circle System Utilized Preoxygenation: Pre-oxygenation with 100% oxygen Induction Type: IV induction, Rapid sequence and Cricoid Pressure applied Laryngoscope Size: Mac and 3 Grade View: Grade I Tube type: Oral Tube size: 7.0 mm Number of attempts: 1 Airway Equipment and Method: Stylet and Oral airway Placement Confirmation: ETT inserted through vocal cords under direct vision,  positive ETCO2 and breath sounds checked- equal and bilateral Secured at: 21 cm Tube secured with: Tape Dental Injury: Teeth and Oropharynx as per pre-operative assessment

## 2018-12-22 NOTE — Progress Notes (Signed)
Subjective/Chief Complaint: Pt with con't abdominal pain Hernia con't not to be reducible   Objective: Vital signs in last 24 hours: Temp:  [98.1 F (36.7 C)-98.8 F (37.1 C)] 98.1 F (36.7 C) (04/17 0520) Pulse Rate:  [94-110] 94 (04/17 0520) Resp:  [16-17] 16 (04/17 0520) BP: (123-137)/(58-71) 137/71 (04/17 0520) SpO2:  [97 %-99 %] 99 % (04/17 0520) Last BM Date: 12/17/18  Intake/Output from previous day: 04/16 0701 - 04/17 0700 In: 1301.3 [P.O.:120; I.V.:822.1; IV Piggyback:359.2] Out: 952 [Urine:502; Emesis/NG output:450] Intake/Output this shift: No intake/output data recorded.  Constitutional: No acute distress, conversant, appears states age. Eyes: Anicteric sclerae, moist conjunctiva, no lid lag Lungs: Clear to auscultation bilaterally, normal respiratory effort CV: regular rate and rhythm, no murmurs, no peripheral edema, pedal pulses 2+ GI: Soft, no masses or hepatosplenomegaly, incarcerated VH with erythema on skin Skin: No rashes, palpation reveals normal turgor Psychiatric: appropriate judgment and insight, oriented to person, place, and time   Lab Results:  Recent Labs    12/21/18 0321 12/22/18 0118  WBC 10.4 9.9  HGB 14.0 12.5  HCT 40.6 38.6  PLT 227 241   BMET Recent Labs    12/21/18 0321 12/22/18 0118  NA 131* 134*  K 3.4* 3.1*  CL 98 103  CO2 22 25  GLUCOSE 130* 108*  BUN 57* 52*  CREATININE 2.05* 1.09*  CALCIUM 9.7 10.4*   PT/INR No results for input(s): LABPROT, INR in the last 72 hours. ABG No results for input(s): PHART, HCO3 in the last 72 hours.  Invalid input(s): PCO2, PO2  Studies/Results: Ct Abdomen Pelvis Wo Contrast  Result Date: 12/20/2018 CLINICAL DATA:  85 y/o  F; abdominal distention, nausea, vomiting. EXAM: CT ABDOMEN AND PELVIS WITHOUT CONTRAST TECHNIQUE: Multidetector CT imaging of the abdomen and pelvis was performed following the standard protocol without IV contrast. COMPARISON:  None. FINDINGS: Lower  chest: No acute abnormality. Hepatobiliary: No focal liver abnormality is seen. Status post cholecystectomy. No biliary dilatation. Pancreas: Unremarkable. No pancreatic ductal dilatation or surrounding inflammatory changes. Spleen: Normal in size without focal abnormality. Adrenals/Urinary Tract: Left kidney cysts measuring up to 3.1 cm. Bilateral kidney stones the largest measuring 10 mm in lower pole of right kidney. No ureter stone or hydronephrosis. Normal adrenal glands. Normal bladder. Stomach/Bowel: To the left and inferior of the umbilicus is a small hernia with 4 cm neck containing a loop of transverse colon without proximal obstruction. Right lateral to the umbilicus is a large hernia with 4.3 cm neck intending the cecum and terminal ileum. Upstream small bowel is dilated with fluid levels indicating obstruction. The stomach is also distended. There is mild edema and inflammation within the large hernia sac. Pancolonic diverticulosis without findings of acute diverticulitis. Vascular/Lymphatic: Aortic atherosclerosis. No enlarged abdominal or pelvic lymph nodes. Reproductive: Uterus and bilateral adnexa are unremarkable. Other: No ascites. Musculoskeletal: No fracture is seen. IMPRESSION: 1. Small bowel obstruction secondary to large hernia to the right of the umbilicus containing cecum and terminal ileum. 2. Small hernia to the left and inferior of umbilicus containing loop of transverse colon without obstruction. 3. Bilateral kidney stones. No hydronephrosis or ureter stone. 4. Pancolonic diverticulosis without findings of acute diverticulitis. 5. Aortic Atherosclerosis (ICD10-I70.0). Electronically Signed   By: Kristine Garbe M.D.   On: 12/20/2018 22:33   Dg Abd 1 View  Result Date: 12/20/2018 CLINICAL DATA:  Check gastric catheter placement EXAM: ABDOMEN - 1 VIEW COMPARISON:  12/20/2018 FINDINGS: Gastric catheter is noted within the stomach. Cardiac  shadows within normal limits. The  lungs are clear with the exception of minimal left basilar atelectasis. Stable right renal stone. IMPRESSION: Gastric catheter within the stomach. Electronically Signed   By: Inez Catalina M.D.   On: 12/20/2018 23:48   Dg Abdomen Acute W/chest  Result Date: 12/20/2018 CLINICAL DATA:  85 year old female with a history of right lower quadrant pain EXAM: DG ABDOMEN ACUTE W/ 1V CHEST COMPARISON:  None. FINDINGS: Chest: Cardiomediastinal silhouette within normal limits. No evidence of central vascular congestion. No pneumothorax or pleural effusion. No confluent airspace disease. Abdomen: Portions of the abdomen have been excluded given the patient's body habitus. Partially distended small bowel loops. Relative paucity of gas within the mid abdomen. Calcifications projecting over the right renal silhouette. Surgical changes of the pelvis. No displaced fracture. IMPRESSION: Chest: No radiographic evidence of acute cardiopulmonary disease. Abdomen: Limited plain film abdomen demonstrates borderline distended small bowel loops, with nonspecific bowel gas pattern. If there is concern for acute intra-abdominal process, correlation with CT may be indicated. Questionable right-sided nephrolithiasis. Electronically Signed   By: Corrie Mckusick D.O.   On: 12/20/2018 20:25   Dg Abd Portable 1v  Result Date: 12/21/2018 CLINICAL DATA:  NG position EXAM: PORTABLE ABDOMEN - 1 VIEW COMPARISON:  12/20/2018 FINDINGS: NG tube in the stomach with the tip in the body the stomach unchanged from yesterday. Dilated small bowel loops right upper quadrant consistent with small-bowel obstruction. IMPRESSION: NG tube remains in the body of the  stomach Small bowel remains dilated. Electronically Signed   By: Franchot Gallo M.D.   On: 12/21/2018 12:13    Anti-infectives: Anti-infectives (From admission, onward)   Start     Dose/Rate Route Frequency Ordered Stop   12/20/18 2130  piperacillin-tazobactam (ZOSYN) IVPB 3.375 g     3.375  g 100 mL/hr over 30 Minutes Intravenous  Once 12/20/18 2124 12/20/18 2329      Assessment/Plan: 85 F with incarcerated VH possible strangulation of bowel  I'm concerned with bowel strangulation secondary to incarcerated hernia.  We will need to do ex lap and possible SBR and hernia repair.  I d/w the patient in detail the procedure.  Her questions were answered.  -to OR for ex lap -All risks and benefits were discussed with the patient to generally include: infection, bleeding, possible need for for more surgery, possible prolonged ventilation. Alternatives were offered and described.  All questions were answered and the patient voiced understanding of the procedure and wishes to proceed at this point with a ex lap    LOS: 1 day    Ralene Ok 12/22/2018

## 2018-12-22 NOTE — Progress Notes (Signed)
Pharmacy Antibiotic Note  Felicia Stephens is a 85 y.o. female admitted on 12/20/2018 with intraabdominal infection.  Pharmacy has been consulted for Zosyn dosing.  Plan: Zosyn 3.375g IV q8h (4 hour infusion).  Monitor renal function and C&S.  Height: 5' 3.5" (161.3 cm) Weight: 160 lb (72.6 kg) IBW/kg (Calculated) : 53.55  Temp (24hrs), Avg:98 F (36.7 C), Min:97.6 F (36.4 C), Max:98.8 F (37.1 C)  Recent Labs  Lab 12/20/18 1939 12/20/18 2039 12/20/18 2245 12/21/18 0321 12/22/18 0118  WBC 13.1*  --   --  10.4 9.9  CREATININE  --  2.24*  --  2.05* 1.09*  LATICACIDVEN  --  2.3* 2.1*  --   --     Estimated Creatinine Clearance: 37.8 mL/min (A) (by C-G formula based on SCr of 1.09 mg/dL (H)).    No Known Allergies  Antimicrobials this admission: Zosyn 4/17 >>   Thank you for allowing pharmacy to be a part of this patient's care.  Alanda Slim, PharmD, [facility] Clinical Pharmacist Please see AMION for all Pharmacists' Contact Phone Numbers 12/22/2018, 1:31 PM

## 2018-12-22 NOTE — Progress Notes (Signed)
Wedding band remove from finger, Percell Locus RN notified to come retrieve it.

## 2018-12-23 ENCOUNTER — Encounter (HOSPITAL_COMMUNITY): Payer: Self-pay | Admitting: General Surgery

## 2018-12-23 LAB — CBC
HCT: 40.8 % (ref 36.0–46.0)
Hemoglobin: 13.2 g/dL (ref 12.0–15.0)
MCH: 29.9 pg (ref 26.0–34.0)
MCHC: 32.4 g/dL (ref 30.0–36.0)
MCV: 92.3 fL (ref 80.0–100.0)
Platelets: 257 10*3/uL (ref 150–400)
RBC: 4.42 MIL/uL (ref 3.87–5.11)
RDW: 12.4 % (ref 11.5–15.5)
WBC: 12.6 10*3/uL — ABNORMAL HIGH (ref 4.0–10.5)
nRBC: 0 % (ref 0.0–0.2)

## 2018-12-23 LAB — BASIC METABOLIC PANEL
Anion gap: 10 (ref 5–15)
BUN: 46 mg/dL — ABNORMAL HIGH (ref 8–23)
CO2: 19 mmol/L — ABNORMAL LOW (ref 22–32)
Calcium: 10.1 mg/dL (ref 8.9–10.3)
Chloride: 106 mmol/L (ref 98–111)
Creatinine, Ser: 1.19 mg/dL — ABNORMAL HIGH (ref 0.44–1.00)
GFR calc Af Amer: 49 mL/min — ABNORMAL LOW (ref >60)
GFR calc non Af Amer: 42 mL/min — ABNORMAL LOW (ref >60)
Glucose, Bld: 137 mg/dL — ABNORMAL HIGH (ref 70–99)
Potassium: 4.6 mmol/L (ref 3.5–5.1)
Sodium: 135 mmol/L (ref 135–145)

## 2018-12-23 NOTE — Progress Notes (Addendum)
PROGRESS NOTE   Felicia Stephens  NOB:[id number]    DOB: Apr 12, 1933    DOA: 12/20/2018  PCP: Lajean Manes, MD   I have briefly reviewed patients previous medical records in [facility].  Brief Narrative:  85 year old female with PMH of HLD, HTN, large ventral incisional hernia presented to [facility] ED on 4/15 due to 3 days history of acute onset of abdominal pain, nausea and vomiting.  She was initially admitted for SBO due to large hernia.  Failed conservative management including bowel rest and NG tube.  On 4/17, general surgery performed emergent exploratory laparotomy, small bowel resection and hernia repair for incarcerated ventral hernia with ischemic bowel.   Assessment & Plan:   Principal Problem:   Ventral hernia with bowel obstruction Active Problems:   HTN (hypertension)   Incarcerated ventral hernia with ischemic bowel: History as noted above.  Failed conservative measures including NPO, NG tube drainage, IV fluids.  On 4/17, general surgery performed emergent exploratory laparotomy, small bowel resection and hernia repair for incarcerated ventral hernia with ischemic bowel.  Management per CCS, continue NPO, NG tube, IV fluids until bowel function returns.  Mobilizing.  Acute kidney injury: Due to GI losses and poor oral intake.  No hydronephrosis on CT abdomen.  This had resolved preop but creatinine up slightly to 1.19.  Continue IV fluids and follow BMP in a.m.  Hypokalemia: Replaced.  Magnesium normal.  Acute metabolic encephalopathy: Multifactorial due to acute medical illness including SBO, acute kidney injury complicating sleep deprivation, hospitalization etc.  Treat underlying cause.  Delirium precautions.  Minimize opioid use.  Sleep hygiene.  Monitor closely.  Seems to be better compared to yesterday.  Essential hypertension: Home lisinopril-HCTZ on hold.  Controlled.  Monitor closely.  Hyperlipidemia: Holding statins due to GI issues as above.  Can resume when able  to take p.o.  Dehydration with hyponatremia: Related to poor oral intake and GI losses from SBO.  Dehydration and hyponatremia have resolved but continue D5 half NS due to n.p.o.   Isolated hyperbilirubinemia: Unclear etiology.  Follow LFTs in a.m.   DVT prophylaxis: SCDs Code Status: Full Family Communication: Unsuccessfully attempted to reach spouse and her daughter via phone. Disposition: To be determined pending clinical improvement.   Consultants:  General surgery  Procedures:  NG tube 4/16 > 4/17: Exploratory laparotomy, small bowel resection and hernia repair for incarcerated ventral hernia with ischemic bowel.   Antimicrobials:  None.   Subjective: Seen this morning.  Sitting comfortably in reclining chair.  Slightly confused but better compared to yesterday.  Oriented x2.  Appropriate postop abdominal pain.  Denies chest pain or dyspnea.  As per RN, no acute issues noted.  ROS: As above, otherwise negative.  Objective:  Vitals:   12/22/18 2151 12/23/18 0203 12/23/18 0535 12/23/18 0948  BP: (!) 132/56 (!) 113/51 (!) 114/55 (!) 112/56  Pulse: (!) 113 (!) 101 (!) 108 (!) 104  Resp: 18 18 18    Temp: 98 F (36.7 C) 97.6 F (36.4 C) 98.3 F (36.8 C) 98.2 F (36.8 C)  TempSrc: Oral Oral Oral Oral  SpO2: 95% 97% 96% 95%  Weight:      Height:        Examination:  General exam: Pleasant elderly female, moderately built and nourished sitting up comfortably in reclining chair.  Oral mucosa moist. Respiratory system: Clear to auscultation.  No increased work of breathing. Cardiovascular system: S1 & S2 heard, mild regular tachycardia. No JVD, murmurs, rubs, gallops or clicks.  No pedal edema. Gastrointestinal system: Lower abdominal surgical site dressing clean and dry.  Appropriate postop abdominal tenderness.  Positive bowel sounds.  NG tube in place. Central nervous system: Alert and oriented x2. No focal neurological deficits. Extremities: Symmetric 5 x 5 power.  Skin: No rashes, lesions or ulcers Psychiatry: Judgement and insight appear somewhat impaired. Mood & affect pleasant.     Data Reviewed: I have personally reviewed following labs and imaging studies  CBC: Recent Labs  Lab 12/20/18 1939 12/21/18 0321 12/22/18 0118 12/23/18 0633  WBC 13.1* 10.4 9.9 12.6*  NEUTROABS 10.6*  --   --   --   HGB 16.2* 14.0 12.5 13.2  HCT 47.7* 40.6 38.6 40.8  MCV 92.1 90.8 92.1 92.3  PLT 310 227 241 245   Basic Metabolic Panel: Recent Labs  Lab 12/20/18 2039 12/21/18 0321 12/22/18 0118 12/22/18 1632 12/23/18 0321  NA 130* 131* 134* 138 135  K 3.4* 3.4* 3.1* 3.7 4.6  CL 94* 98 103 105 106  CO2 23 22 25 22  19*  GLUCOSE 128* 130* 108* 107* 137*  BUN 50* 57* 52* 38* 46*  CREATININE 2.24* 2.05* 1.09* 0.90 1.19*  CALCIUM 10.2 9.7 10.4* 10.5* 10.1  MG  --   --  1.8  --   --    Liver Function Tests: Recent Labs  Lab 12/20/18 2039  AST 22  ALT 12  ALKPHOS 80  BILITOT 3.5*  PROT 6.9  ALBUMIN 3.1*     Recent Results (from the past 240 hour(s))  Blood culture (routine x 2)     Status: None (Preliminary result)   Collection Time: 12/20/18 10:36 PM  Result Value Ref Range Status   Specimen Description SITE NOT SPECIFIED  Final   Special Requests   Final    BOTTLES DRAWN AEROBIC AND ANAEROBIC Blood Culture results may not be optimal due to an inadequate volume of blood received in culture bottles   Culture   Final    NO GROWTH 3 DAYS Performed at [facility], [address] [place], [place] [address]    Report Status PENDING  Incomplete  Blood culture (routine x 2)     Status: None (Preliminary result)   Collection Time: 12/20/18 10:38 PM  Result Value Ref Range Status   Specimen Description BLOOD LEFT ANTECUBITAL  Final   Special Requests   Final    BOTTLES DRAWN AEROBIC AND ANAEROBIC Blood Culture results may not be optimal due to an inadequate volume of blood received in culture bottles   Culture   Final    NO GROWTH 3  DAYS Performed at [facility], [place] [address] [place],  [address]    Report Status PENDING  Incomplete  Surgical pcr screen     Status: Abnormal   Collection Time: 12/22/18  8:46 AM  Result Value Ref Range Status   MRSA, PCR NEGATIVE NEGATIVE Final   Staphylococcus aureus POSITIVE (A) NEGATIVE Final    Comment: (NOTE) The Xpert SA Assay (FDA approved for NASAL specimens in patients 11 years of age and older), is one component of a comprehensive surveillance program. It is not intended to diagnose infection nor to guide or monitor treatment. Performed at [facility], [place] [address] [place],  [address]          Radiology Studies: No results found.      Scheduled Meds: . enoxaparin (LOVENOX) injection  40 mg Subcutaneous Q24H  . fluticasone  1  spray Each Nare Daily   Continuous Infusions: . dextrose 5 % and 0.45 % NaCl with KCl 10 mEq/L 75 mL/hr at 12/23/18 1636  . piperacillin-tazobactam (ZOSYN)  IV 3.375 g (12/23/18 1342)     LOS: 2 days     Vernell Leep, MD, FACP, [facility]. [facility]  To contact the attending provider between 7A-7P or the covering provider during after hours 7P-7A, please log into the web site [email or web address] and access using universal [place] password for that web site. If you do not have the password, please call the hospital operator.  12/23/2018, 5:33 PM

## 2018-12-23 NOTE — Progress Notes (Signed)
Patient ID: Felicia Stephens, female   DOB: [birth year], 85 y.o.   MRN: [id number] [facility] Progress Note:   1 Day Post-Op  Subjective: Mental status is clear.  Wanting to talk.  Tolerating NG Objective: Vital signs in last 24 hours: Temp:  [97.5 F (36.4 C)-98.3 F (36.8 C)] 98.3 F (36.8 C) (04/18 0535) Pulse Rate:  [95-116] 108 (04/18 0535) Resp:  [17-26] 18 (04/18 0535) BP: (113-158)/(51-69) 114/55 (04/18 0535) SpO2:  [95 %-100 %] 96 % (04/18 0535) Weight:  [72.6 kg] 72.6 kg (04/17 0946)  Intake/Output from previous day: 04/17 0701 - 04/18 0700 In: 3323.3 [I.V.:3011.4; IV Piggyback:311.9] Out: 925 [Urine:655; Emesis/NG output:150; Drains:45; Blood:75] Intake/Output this shift: No intake/output data recorded.  Physical Exam: Work of breathing is not labored.  Sore as expected.  No flatus.    Lab Results:  Results for orders placed or performed during the hospital encounter of 12/20/18 (from the past 48 hour(s))  CBC     Status: None   Collection Time: 12/22/18  1:18 AM  Result Value Ref Range   WBC 9.9 4.0 - 10.5 K/uL   RBC 4.19 3.87 - 5.11 MIL/uL   Hemoglobin 12.5 12.0 - 15.0 g/dL   HCT 38.6 36.0 - 46.0 %   MCV 92.1 80.0 - 100.0 fL   MCH 29.8 26.0 - 34.0 pg   MCHC 32.4 30.0 - 36.0 g/dL   RDW 12.2 11.5 - 15.5 %   Platelets 241 150 - 400 K/uL   nRBC 0.0 0.0 - 0.2 %    Comment: Performed at [facility], [place] [address], [place], [place] [address]  Basic metabolic panel     Status: Abnormal   Collection Time: 12/22/18  1:18 AM  Result Value Ref Range   Sodium 134 (L) 135 - 145 mmol/L   Potassium 3.1 (L) 3.5 - 5.1 mmol/L   Chloride 103 98 - 111 mmol/L   CO2 25 22 - 32 mmol/L   Glucose, Bld 108 (H) 70 - 99 mg/dL   BUN 52 (H) 8 - 23 mg/dL   Creatinine, Ser 1.09 (H) 0.44 - 1.00 mg/dL   Calcium 10.4 (H) 8.9 - 10.3 mg/dL   GFR calc non Af Amer 47 (L) >60 mL/min   GFR calc Af Amer 54 (L) >60 mL/min   Anion gap 6 5 - 15    Comment: Performed at [facility] [address] [place], [place] [address]  Magnesium     Status: None   Collection Time: 12/22/18  1:18 AM  Result Value Ref Range   Magnesium 1.8 1.7 - 2.4 mg/dL    Comment: Performed at [facility] [address], [place], [place] [address]  Surgical pcr screen     Status: Abnormal   Collection Time: 12/22/18  8:46 AM  Result Value Ref Range   MRSA, PCR NEGATIVE NEGATIVE   Staphylococcus aureus POSITIVE (A) NEGATIVE    Comment: (NOTE) The Xpert SA Assay (FDA approved for NASAL specimens in patients 48 years of age and older), is one component of a comprehensive surveillance program. It is not intended to diagnose infection nor to guide or monitor treatment. Performed at [facility], [place] [address] [place], [place] [address]   Basic metabolic panel     Status: Abnormal   Collection Time: 12/22/18  4:32 PM  Result Value Ref Range   Sodium 138 135 - 145 mmol/L   Potassium 3.7 3.5 -  5.1 mmol/L   Chloride 105 98 - 111 mmol/L   CO2 22 22 - 32 mmol/L   Glucose, Bld 107 (H) 70 - 99 mg/dL   BUN 38 (H) 8 - 23 mg/dL   Creatinine, Ser 0.90 0.44 - 1.00 mg/dL   Calcium 10.5 (H) 8.9 - 10.3 mg/dL   GFR calc non Af Amer 59 (L) >60 mL/min   GFR calc Af Amer >60 >60 mL/min   Anion gap 11 5 - 15    Comment: Performed at [facility] [address], [place], [place] [address]  Basic metabolic panel     Status: Abnormal   Collection Time: 12/23/18  3:21 AM  Result Value Ref Range   Sodium 135 135 - 145 mmol/L   Potassium 4.6 3.5 - 5.1 mmol/L    Comment: DELTA CHECK NOTED   Chloride 106 98 - 111 mmol/L   CO2 19 (L) 22 - 32 mmol/L   Glucose, Bld 137 (H) 70 - 99 mg/dL   BUN 46 (H) 8 - 23 mg/dL   Creatinine, Ser 1.19 (H) 0.44 - 1.00 mg/dL   Calcium 10.1 8.9 - 10.3 mg/dL   GFR calc non Af Amer 42 (L) >60 mL/min   GFR calc Af Amer 49 (L) >60 mL/min   Anion gap 10 5 - 15    Comment: Performed at [facility] [address], [place], [place] [address]   CBC     Status: Abnormal   Collection Time: 12/23/18  6:33 AM  Result Value Ref Range   WBC 12.6 (H) 4.0 - 10.5 K/uL   RBC 4.42 3.87 - 5.11 MIL/uL   Hemoglobin 13.2 12.0 - 15.0 g/dL   HCT 40.8 36.0 - 46.0 %   MCV 92.3 80.0 - 100.0 fL   MCH 29.9 26.0 - 34.0 pg   MCHC 32.4 30.0 - 36.0 g/dL   RDW 12.4 11.5 - 15.5 %   Platelets 257 150 - 400 K/uL   nRBC 0.0 0.0 - 0.2 %    Comment: Performed at [facility], [place] [address], [place],  [address]    Radiology/Results: Dg Abd Portable 1v  Result Date: 12/21/2018 CLINICAL DATA:  NG position EXAM: PORTABLE ABDOMEN - 1 VIEW COMPARISON:  12/20/2018 FINDINGS: NG tube in the stomach with the tip in the body the stomach unchanged from yesterday. Dilated small bowel loops right upper quadrant consistent with small-bowel obstruction. IMPRESSION: NG tube remains in the body of the  stomach Small bowel remains dilated. Electronically Signed   By: Franchot Gallo M.D.   On: 12/21/2018 12:13    Anti-infectives: Anti-infectives (From admission, onward)   Start     Dose/Rate Route Frequency Ordered Stop   12/22/18 1400  piperacillin-tazobactam (ZOSYN) IVPB 3.375 g     3.375 g 12.5 mL/hr over 240 Minutes Intravenous Every 8 hours 12/22/18 1332     12/22/18 1100  cefoTEtan in Dextrose 5% (CEFOTAN) IVPB 2 g  Status:  Discontinued     2 g 100 mL/hr over 30 Minutes Intravenous Every 12 hours 12/22/18 1047 12/22/18 1315   12/20/18 2130  piperacillin-tazobactam (ZOSYN) IVPB 3.375 g     3.375 g 100 mL/hr over 30 Minutes Intravenous  Once 12/20/18 2124 12/20/18 2329      Assessment/Plan: Problem List: Patient Active Problem List   Diagnosis Date Noted  . Ventral hernia with bowel obstruction 12/21/2018  . HTN (hypertension) 12/21/2018    Postop ventral hernia repair.  No flatus.  Cont obs and NG 1 Day Post-Op    LOS: 2 days   Matt B. Hassell Done, MD, [facility] [phone] beeper [phone]  12/23/2018 7:48  AM

## 2018-12-24 LAB — HEPATIC FUNCTION PANEL
ALT: 18 U/L (ref 0–44)
AST: 36 U/L (ref 15–41)
Albumin: 1.6 g/dL — ABNORMAL LOW (ref 3.5–5.0)
Alkaline Phosphatase: 70 U/L (ref 38–126)
Bilirubin, Direct: 1.2 mg/dL — ABNORMAL HIGH (ref 0.0–0.2)
Indirect Bilirubin: 1.4 mg/dL — ABNORMAL HIGH (ref 0.3–0.9)
Total Bilirubin: 2.6 mg/dL — ABNORMAL HIGH (ref 0.3–1.2)
Total Protein: 5 g/dL — ABNORMAL LOW (ref 6.5–8.1)

## 2018-12-24 LAB — CBC
HCT: 33.8 % — ABNORMAL LOW (ref 36.0–46.0)
Hemoglobin: 11 g/dL — ABNORMAL LOW (ref 12.0–15.0)
MCH: 30.4 pg (ref 26.0–34.0)
MCHC: 32.5 g/dL (ref 30.0–36.0)
MCV: 93.4 fL (ref 80.0–100.0)
Platelets: 246 10*3/uL (ref 150–400)
RBC: 3.62 MIL/uL — ABNORMAL LOW (ref 3.87–5.11)
RDW: 12.4 % (ref 11.5–15.5)
WBC: 12.2 10*3/uL — ABNORMAL HIGH (ref 4.0–10.5)
nRBC: 0 % (ref 0.0–0.2)

## 2018-12-24 LAB — BASIC METABOLIC PANEL
Anion gap: 9 (ref 5–15)
BUN: 34 mg/dL — ABNORMAL HIGH (ref 8–23)
CO2: 23 mmol/L (ref 22–32)
Calcium: 10 mg/dL (ref 8.9–10.3)
Chloride: 106 mmol/L (ref 98–111)
Creatinine, Ser: 0.78 mg/dL (ref 0.44–1.00)
GFR calc Af Amer: >60 mL/min (ref >60)
GFR calc non Af Amer: >60 mL/min (ref >60)
Glucose, Bld: 183 mg/dL — ABNORMAL HIGH (ref 70–99)
Potassium: 3.5 mmol/L (ref 3.5–5.1)
Sodium: 138 mmol/L (ref 135–145)

## 2018-12-24 NOTE — Progress Notes (Signed)
Patient ID: Felicia Stephens, female   DOB: 1933-01-23, 85 y.o.   MRN: [id number] [facility] Surgery Progress Note:   2 Days Post-Op  Subjective: Mental status is awake and talkative;  So specific complaints Objective: Vital signs in last 24 hours: Temp:  [98.2 F (36.8 C)-98.7 F (37.1 C)] 98.7 F (37.1 C) (04/19 0418) Pulse Rate:  [91-104] 91 (04/19 0418) BP: (112-131)/(55-63) 121/55 (04/19 0418) SpO2:  [95 %-100 %] 96 % (04/19 0418)  Intake/Output from previous day: 04/18 0701 - 04/19 0700 In: 690 [I.V.:610; NG/GT:30; IV Piggyback:50] Out: 1440 [Urine:800; Emesis/NG output:620; Drains:20] Intake/Output this shift: No intake/output data recorded.  Physical Exam: Work of breathing is not labored,  NG in place and draining dark green bilious material ;  JP with serosanguinous drainage-scan  Lab Results:  Results for orders placed or performed during the hospital encounter of 12/20/18 (from the past 48 hour(s))  Surgical pcr screen     Status: Abnormal   Collection Time: 12/22/18  8:46 AM  Result Value Ref Range   MRSA, PCR NEGATIVE NEGATIVE   Staphylococcus aureus POSITIVE (A) NEGATIVE    Comment: (NOTE) The Xpert SA Assay (FDA approved for NASAL specimens in patients 95 years of age and older), is one component of a comprehensive surveillance program. It is not intended to diagnose infection nor to guide or monitor treatment. Performed at [facility], [place] [address], [place], [place] [address]   Basic metabolic panel     Status: Abnormal   Collection Time: 12/22/18  4:32 PM  Result Value Ref Range   Sodium 138 135 - 145 mmol/L   Potassium 3.7 3.5 - 5.1 mmol/L   Chloride 105 98 - 111 mmol/L   CO2 22 22 - 32 mmol/L   Glucose, Bld 107 (H) 70 - 99 mg/dL   BUN 38 (H) 8 - 23 mg/dL   Creatinine, Ser 0.90 0.44 - 1.00 mg/dL   Calcium 10.5 (H) 8.9 - 10.3 mg/dL   GFR calc non Af Amer 59 (L) >60 mL/min   GFR calc Af Amer >60 >60 mL/min   Anion gap 11 5 - 15   Comment: Performed at [place] [address] [place], [place] [address]  Basic metabolic panel     Status: Abnormal   Collection Time: 12/23/18  3:21 AM  Result Value Ref Range   Sodium 135 135 - 145 mmol/L   Potassium 4.6 3.5 - 5.1 mmol/L    Comment: DELTA CHECK NOTED   Chloride 106 98 - 111 mmol/L   CO2 19 (L) 22 - 32 mmol/L   Glucose, Bld 137 (H) 70 - 99 mg/dL   BUN 46 (H) 8 - 23 mg/dL   Creatinine, Ser 1.19 (H) 0.44 - 1.00 mg/dL   Calcium 10.1 8.9 - 10.3 mg/dL   GFR calc non Af Amer 42 (L) >60 mL/min   GFR calc Af Amer 49 (L) >60 mL/min   Anion gap 10 5 - 15    Comment: Performed at [place] [address], [place] [address]  CBC     Status: Abnormal   Collection Time: 12/23/18  6:33 AM  Result Value Ref Range   WBC 12.6 (H) 4.0 - 10.5 K/uL   RBC 4.42 3.87 - 5.11 MIL/uL   Hemoglobin 13.2 12.0 - 15.0 g/dL   HCT 40.8 36.0 - 46.0 %   MCV 92.3 80.0 - 100.0 fL   MCH 29.9 26.0 - 34.0  pg   MCHC 32.4 30.0 - 36.0 g/dL   RDW 12.4 11.5 - 15.5 %   Platelets 257 150 - 400 K/uL   nRBC 0.0 0.0 - 0.2 %    Comment: Performed at [facility], [place] [address], [place], [place] [address]  CBC     Status: Abnormal   Collection Time: 12/24/18  3:26 AM  Result Value Ref Range   WBC 12.2 (H) 4.0 - 10.5 K/uL   RBC 3.62 (L) 3.87 - 5.11 MIL/uL   Hemoglobin 11.0 (L) 12.0 - 15.0 g/dL   HCT 33.8 (L) 36.0 - 46.0 %   MCV 93.4 80.0 - 100.0 fL   MCH 30.4 26.0 - 34.0 pg   MCHC 32.5 30.0 - 36.0 g/dL   RDW 12.4 11.5 - 15.5 %   Platelets 246 150 - 400 K/uL   nRBC 0.0 0.0 - 0.2 %    Comment: Performed at [facility], [place] [address] [place], [place] [address]  Basic metabolic panel     Status: Abnormal   Collection Time: 12/24/18  3:26 AM  Result Value Ref Range   Sodium 138 135 - 145 mmol/L   Potassium 3.5 3.5 - 5.1 mmol/L    Comment: DELTA CHECK NOTED   Chloride 106 98 - 111 mmol/L   CO2 23 22 - 32 mmol/L   Glucose, Bld 183 (H) 70 - 99 mg/dL   BUN 34  (H) 8 - 23 mg/dL   Creatinine, Ser 0.78 0.44 - 1.00 mg/dL   Calcium 10.0 8.9 - 10.3 mg/dL   GFR calc non Af Amer >60 >60 mL/min   GFR calc Af Amer >60 >60 mL/min   Anion gap 9 5 - 15    Comment: Performed at [facility], [place] [address], [place], [place] [address]  Hepatic function panel     Status: Abnormal   Collection Time: 12/24/18  3:26 AM  Result Value Ref Range   Total Protein 5.0 (L) 6.5 - 8.1 g/dL   Albumin 1.6 (L) 3.5 - 5.0 g/dL   AST 36 15 - 41 U/L   ALT 18 0 - 44 U/L   Alkaline Phosphatase 70 38 - 126 U/L   Total Bilirubin 2.6 (H) 0.3 - 1.2 mg/dL   Bilirubin, Direct 1.2 (H) 0.0 - 0.2 mg/dL   Indirect Bilirubin 1.4 (H) 0.3 - 0.9 mg/dL    Comment: Performed at [facility] [address], [place], [place] [address]    Radiology/Results: No results found.  Anti-infectives: Anti-infectives (From admission, onward)   Start     Dose/Rate Route Frequency Ordered Stop   12/22/18 1400  piperacillin-tazobactam (ZOSYN) IVPB 3.375 g     3.375 g 12.5 mL/hr over 240 Minutes Intravenous Every 8 hours 12/22/18 1332     12/22/18 1100  cefoTEtan in Dextrose 5% (CEFOTAN) IVPB 2 g  Status:  Discontinued     2 g 100 mL/hr over 30 Minutes Intravenous Every 12 hours 12/22/18 1047 12/22/18 1315   12/20/18 2130  piperacillin-tazobactam (ZOSYN) IVPB 3.375 g     3.375 g 100 mL/hr over 30 Minutes Intravenous  Once 12/20/18 2124 12/20/18 2329      Assessment/Plan: Problem List: Patient Active Problem List   Diagnosis Date Noted  . Ventral hernia with bowel obstruction 12/21/2018  . HTN (hypertension) 12/21/2018    Labs today are unchanged.  She still has an ileus which is expected after such surgery.  She has not  passed flatus yet.  Will continue NPO and NG suction for now.   2 Days Post-Op    LOS: 3 days   Matt B. Hassell Done, MD, [facility] [phone] beeper [phone]  12/24/2018 8:02 AM

## 2018-12-24 NOTE — Progress Notes (Signed)
Talked with pt daughter Clair Gulling about patient. She wants the physician or surgeon to contact her to talk about patient surgery @ [phone]. Notified day shift nurse of daughter concern.

## 2018-12-24 NOTE — Progress Notes (Signed)
PROGRESS NOTE   Felicia ELIZARDO  ZDG:[id number]    DOB: 01/19/1933    DOA: 12/20/2018  PCP: Lajean Manes, MD   I have briefly reviewed patients previous medical records in [facility].  Brief Narrative:  85 year old female with PMH of HLD, HTN, large ventral incisional hernia presented to [facility] ED on 4/15 due to 3 days history of acute onset of abdominal pain, nausea and vomiting.  She was initially admitted for SBO due to large hernia.  Failed conservative management including bowel rest and NG tube.  On 4/17, general surgery performed emergent exploratory laparotomy, small bowel resection and hernia repair for incarcerated ventral hernia with ischemic bowel.  Ongoing postop ileus, expected.   Assessment & Plan:   Principal Problem:   Ventral hernia with bowel obstruction Active Problems:   HTN (hypertension)   Incarcerated ventral hernia with ischemic bowel: History as noted above.  Failed conservative measures including NPO, NG tube drainage, IV fluids.  On 4/17, general surgery performed emergent exploratory laparotomy, small bowel resection and hernia repair for incarcerated ventral hernia with ischemic bowel.  Management per [facility]CS, continue NPO, NG tube, IV fluids until bowel function returns.  Mobilizing.  Still has ileus which is expected.  Continue current management.  Acute kidney injury: Due to GI losses and poor oral intake.  No hydronephrosis on CT abdomen.  This had resolved preop but creatinine up slightly to 1.19.  Improved with IV fluids, continue.  Hypokalemia: Replaced.  Magnesium normal.  Acute metabolic encephalopathy: Multifactorial due to acute medical illness including SBO, acute kidney injury complicating sleep deprivation, hospitalization, postop delirium, pain, opoiods etc.  Treat underlying cause.  Delirium precautions.  Minimize opioid use.  Sleep hygiene.  Monitor closely.  Ongoing confusion. As per daughter, she is clearer this afternoon. She has some hearing  impairment.  Essential hypertension: Home lisinopril-HCTZ on hold.  Controlled.  Monitor closely.  Hyperlipidemia: Holding statins due to GI issues as above.  Can resume when able to take p.o.  Dehydration with hyponatremia: Related to poor oral intake and GI losses from SBO.  Dehydration and hyponatremia have resolved but continue D5 half NS due to n.p.o.   Isolated hyperbilirubinemia: Unclear etiology.  Better.  Acute blood loss anemia: Some of it may be dilutional.  Follow CBC in a.m.   DVT prophylaxis: SCDs Code Status: Full Family Communication: discussed in detail with patient's daughter, updated care and answered questions. Disposition: To be determined pending clinical improvement.   Consultants:  General surgery  Procedures:  NG tube 4/16 > 4/17: Exploratory laparotomy, small bowel resection and hernia repair for incarcerated ventral hernia with ischemic bowel.   Antimicrobials:  None.   Subjective: Confused this morning.  Unable to provide much history.  Reports some abdominal pain.  As per RN, apart from confusion, no acute issues reported.  ROS: As above, otherwise negative.  Objective:  Vitals:   12/23/18 0948 12/23/18 1949 12/24/18 0418 12/24/18 1234  BP: (!) 112/56 131/63 (!) 121/55 (!) 142/61  Pulse: (!) 104 94 91 87  Resp:    16  Temp: 98.2 F (36.8 C) 98.4 F (36.9 C) 98.7 F (37.1 C) (!) 97.5 F (36.4 C)  TempSrc: Oral Axillary Oral Axillary  SpO2: 95% 100% 96% 95%  Weight:      Height:        Examination:  General exam: Pleasant elderly female, moderately built and nourished lying comfortably supine in bed.  Oral mucosa moist. Respiratory system: Clear to auscultation.  No increased work of breathing.  Stable. Cardiovascular system: S1 & S2 heard, mild regular tachycardia. No JVD, murmurs, rubs, gallops or clicks. No pedal edema.  Stable. Gastrointestinal system: Lower abdominal surgical site dressing clean and dry.  Appropriate postop  abdominal tenderness-less today.  Paucity of bowel sounds.  NG tube in place. Central nervous system: Alert and oriented x2. No focal neurological deficits. Extremities: Symmetric 5 x 5 power. Skin: No rashes, lesions or ulcers Psychiatry: Judgement and insight appear impaired. Mood & affect pleasant.     Data Reviewed: I have personally reviewed following labs and imaging studies  CBC: Recent Labs  Lab 12/20/18 1939 12/21/18 0321 12/22/18 0118 12/23/18 0633 12/24/18 0326  WBC 13.1* 10.4 9.9 12.6* 12.2*  NEUTROABS 10.6*  --   --   --   --   HGB 16.2* 14.0 12.5 13.2 11.0*  HCT 47.7* 40.6 38.6 40.8 33.8*  MCV 92.1 90.8 92.1 92.3 93.4  PLT 310 227 241 257 676   Basic Metabolic Panel: Recent Labs  Lab 12/21/18 0321 12/22/18 0118 12/22/18 1632 12/23/18 0321 12/24/18 0326  NA 131* 134* 138 135 138  K 3.4* 3.1* 3.7 4.6 3.5  CL 98 103 105 106 106  CO2 22 25 22  19* 23  GLUCOSE 130* 108* 107* 137* 183*  BUN 57* 52* 38* 46* 34*  CREATININE 2.05* 1.09* 0.90 1.19* 0.78  CALCIUM 9.7 10.4* 10.5* 10.1 10.0  MG  --  1.8  --   --   --    Liver Function Tests: Recent Labs  Lab 12/20/18 2039 12/24/18 0326  AST 22 36  ALT 12 18  ALKPHOS 80 70  BILITOT 3.5* 2.6*  PROT 6.9 5.0*  ALBUMIN 3.1* 1.6*     Recent Results (from the past 240 hour(s))  Blood culture (routine x 2)     Status: None (Preliminary result)   Collection Time: 12/20/18 10:36 PM  Result Value Ref Range Status   Specimen Description SITE NOT SPECIFIED  Final   Special Requests   Final    BOTTLES DRAWN AEROBIC AND ANAEROBIC Blood Culture results may not be optimal due to an inadequate volume of blood received in culture bottles   Culture   Final    NO GROWTH 4 DAYS Performed at [facility], [place] [address], [place], [place] [address]    Report Status PENDING  Incomplete  Blood culture (routine x 2)     Status: None (Preliminary result)   Collection Time: 12/20/18 10:38 PM  Result Value Ref Range  Status   Specimen Description BLOOD LEFT ANTECUBITAL  Final   Special Requests   Final    BOTTLES DRAWN AEROBIC AND ANAEROBIC Blood Culture results may not be optimal due to an inadequate volume of blood received in culture bottles   Culture   Final    NO GROWTH 4 DAYS Performed at [facility], [place] [address] [place], [place] [address]    Report Status PENDING  Incomplete  Surgical pcr screen     Status: Abnormal   Collection Time: 12/22/18  8:46 AM  Result Value Ref Range Status   MRSA, PCR NEGATIVE NEGATIVE Final   Staphylococcus aureus POSITIVE (A) NEGATIVE Final    Comment: (NOTE) The Xpert SA Assay (FDA approved for NASAL specimens in patients 48 years of age and older), is one component of a comprehensive surveillance program. It is not intended to diagnose infection nor to guide or monitor treatment. Performed at [facility], [place] [address], [place], [place] [address]          Radiology Studies: No results found.      Scheduled Meds:  enoxaparin (LOVENOX) injection  40 mg Subcutaneous Q24H   fluticasone  1 spray Each Nare Daily   Continuous Infusions:  dextrose 5 % and 0.45 % NaCl with KCl 10 mEq/L 125 mL/hr at 12/24/18 1639   piperacillin-tazobactam (ZOSYN)  IV 3.375 g (12/24/18 1330)     LOS: 3 days     Vernell Leep, MD, FACP, [facility]. [facility]  To contact the attending provider between 7A-7P or the covering provider during after hours 7P-7A, please log into the web site [email or web address] and access using universal [place] password for that web site. If you do not have the password, please call the hospital operator.  12/24/2018, 6:03 PM

## 2018-12-25 LAB — CULTURE, BLOOD (ROUTINE X 2)
Culture: NO GROWTH
Culture: NO GROWTH

## 2018-12-25 LAB — COMPREHENSIVE METABOLIC PANEL
ALT: 19 U/L (ref 0–44)
AST: 30 U/L (ref 15–41)
Albumin: 1.6 g/dL — ABNORMAL LOW (ref 3.5–5.0)
Alkaline Phosphatase: 63 U/L (ref 38–126)
Anion gap: 6 (ref 5–15)
BUN: 21 mg/dL (ref 8–23)
CO2: 26 mmol/L (ref 22–32)
Calcium: 9.5 mg/dL (ref 8.9–10.3)
Chloride: 104 mmol/L (ref 98–111)
Creatinine, Ser: 0.63 mg/dL (ref 0.44–1.00)
GFR calc Af Amer: >60 mL/min (ref >60)
GFR calc non Af Amer: >60 mL/min (ref >60)
Glucose, Bld: 160 mg/dL — ABNORMAL HIGH (ref 70–99)
Potassium: 3.2 mmol/L — ABNORMAL LOW (ref 3.5–5.1)
Sodium: 136 mmol/L (ref 135–145)
Total Bilirubin: 1.6 mg/dL — ABNORMAL HIGH (ref 0.3–1.2)
Total Protein: 5.1 g/dL — ABNORMAL LOW (ref 6.5–8.1)

## 2018-12-25 LAB — CBC
HCT: 34 % — ABNORMAL LOW (ref 36.0–46.0)
Hemoglobin: 11.4 g/dL — ABNORMAL LOW (ref 12.0–15.0)
MCH: 31.1 pg (ref 26.0–34.0)
MCHC: 33.5 g/dL (ref 30.0–36.0)
MCV: 92.6 fL (ref 80.0–100.0)
Platelets: 258 10*3/uL (ref 150–400)
RBC: 3.67 MIL/uL — ABNORMAL LOW (ref 3.87–5.11)
RDW: 12.3 % (ref 11.5–15.5)
WBC: 7.6 10*3/uL (ref 4.0–10.5)
nRBC: 0 % (ref 0.0–0.2)

## 2018-12-25 MED ORDER — KCL IN DEXTROSE-NACL 40-5-0.9 MEQ/L-%-% IV SOLN
INTRAVENOUS | Status: DC
  Administered 2018-12-25 – 2018-12-27 (×4): via INTRAVENOUS
  Filled 2018-12-25 (×5): qty 1000

## 2018-12-25 MED ORDER — CHLORHEXIDINE GLUCONATE CLOTH 2 % EX PADS
6.0000 | MEDICATED_PAD | Freq: Every day | CUTANEOUS | Status: DC
  Administered 2018-12-25 – 2018-12-28 (×4): 6 via TOPICAL

## 2018-12-25 MED ORDER — TRAMADOL HCL 50 MG PO TABS
50.0000 mg | ORAL_TABLET | Freq: Four times a day (QID) | ORAL | Status: DC
  Administered 2018-12-25 – 2018-12-26 (×2): 50 mg via ORAL
  Filled 2018-12-25 (×3): qty 1

## 2018-12-25 MED ORDER — POTASSIUM CHLORIDE 10 MEQ/100ML IV SOLN
10.0000 meq | INTRAVENOUS | Status: AC
  Administered 2018-12-25 (×4): 10 meq via INTRAVENOUS
  Filled 2018-12-25 (×4): qty 100

## 2018-12-25 MED ORDER — POTASSIUM CHLORIDE 2 MEQ/ML IV SOLN
INTRAVENOUS | Status: DC
  Filled 2018-12-25 (×6): qty 1000

## 2018-12-25 MED ORDER — MUPIROCIN 2 % EX OINT
1.0000 "application " | TOPICAL_OINTMENT | Freq: Two times a day (BID) | CUTANEOUS | Status: DC
  Administered 2018-12-25 – 2018-12-29 (×6): 1 via NASAL
  Filled 2018-12-25 (×2): qty 22

## 2018-12-25 NOTE — Care Management Important Message (Signed)
Important Message  Patient Details  Name: Felicia Stephens MRN: [id number] Date of Birth: Apr 06, 1933   Medicare Important Message Given:  Yes    Orbie Pyo 12/25/2018, 3:54 PM

## 2018-12-25 NOTE — Progress Notes (Addendum)
PROGRESS NOTE   Felicia Stephens  NWG:[id number]    DOB: 14-Feb-1933    DOA: 12/20/2018  PCP: Lajean Manes, MD   I have briefly reviewed patients previous medical records in [facility].  Brief Narrative:  85 year old female with PMH of HLD, HTN, large ventral incisional hernia presented to [facility] ED on 4/15 due to 3 days history of acute onset of abdominal pain, nausea and vomiting.  She was initially admitted for SBO due to large hernia.  Failed conservative management including bowel rest and NG tube.  On 4/17, general surgery performed emergent exploratory laparotomy, small bowel resection and hernia repair for incarcerated ventral hernia with ischemic bowel.  Ongoing postop ileus.  Mobilizing.  Awaiting return of bowel function to advance diet.   Assessment & Plan:   Principal Problem:   Ventral hernia with bowel obstruction Active Problems:   HTN (hypertension)   Incarcerated ventral hernia with ischemic bowel: History as noted above.  Failed conservative measures.  On 4/17, general surgery performed primary hernia repair with small bowel resection.  Management per CCS, continue NPO, NG tube, IV fluids until bowel function returns.  Mobilizing.  Still has ileus.  Patient up in chair this morning.  Surgery clamping NG tube and trial of sips.  Acute kidney injury: Due to GI losses and poor oral intake.  No hydronephrosis on CT abdomen.  Resolved.  Hypokalemia: Replace IV and follow BMP in a.m.  Magnesium normal.  Acute metabolic encephalopathy: Multifactorial due to acute medical illness including SBO, acute kidney injury complicating sleep deprivation, hospitalization, postop delirium, pain, opoiods etc.  Treat underlying cause.  Delirium precautions.  Minimize opioid use.  Sleep hygiene.  She has some hearing impairment.  During my visit late morning, mental status seems to be the best it has been in the last 2 days seeing her.  Continue management and monitor closely.  Essential  hypertension: Home lisinopril-HCTZ on hold.  Controlled.  Monitor closely.  Hyperlipidemia: Holding statins due to GI issues as above.  Can resume when able to take p.o.  Dehydration with hyponatremia: Related to poor oral intake and GI losses from SBO.  Dehydration and hyponatremia have resolved but continue D5 half NS due to n.p.o.   Isolated hyperbilirubinemia: Unclear etiology.  Continues to improve.  Acute blood loss anemia: Some of it may be dilutional.  Hemoglobin stable in the 11 g range for the last 2 days.   DVT prophylaxis: SCDs Code Status: Full Family Communication: discussed in detail with patient's daughter, updated care and answered questions. Disposition: As per PT, can return home with PT pending clinical improvement.   Consultants:  General surgery  Procedures:  NG tube 4/16 > 4/17: Exploratory laparotomy, small bowel resection and hernia repair for incarcerated ventral hernia with ischemic bowel. Foley catheter discontinued 4/20.  Antimicrobials:  IV Zosyn 4/15 >.   Subjective: Patient seen sitting comfortably in reclining chair.  Tells me that her Foley catheter was removed.  Reports mild abdominal pain.  Unable to say whether she passed flatus or had a BM or not.  Informs me that she did speak with her family on the phone yesterday.  As per RN, no acute issues noted.  ROS: As above, otherwise negative.  Objective:  Vitals:   12/24/18 1234 12/24/18 1958 12/25/18 0322 12/25/18 1433  BP: (!) 142/61 (!) 143/64 (!) 141/59 (!) 168/67  Pulse: 87 82 72 77  Resp: 16   18  Temp: (!) 97.5 F (36.4 C) 97.7 F (  36.5 C) 98.3 F (36.8 C) 98.5 F (36.9 C)  TempSrc: Axillary Axillary Axillary Oral  SpO2: 95% 98% 98% 100%  Weight:      Height:        Examination:  General exam: Pleasant elderly female, moderately built and nourished sitting up comfortably in reclining chair.  Oral mucosa moist. Respiratory system: Clear to auscultation.  No increased work of  breathing. Cardiovascular system: S1 & S2 heard, mild regular tachycardia. No JVD, murmurs, rubs, gallops or clicks. No pedal edema.  Stable. Gastrointestinal system: Lower abdominal surgical site dressing clean and dry.  Appropriate postop abdominal tenderness- continues to improve.  Paucity of bowel sounds.  NG tube in place. Central nervous system: Alert and oriented x person, place and year. No focal neurological deficits. Extremities: Symmetric 5 x 5 power. Skin: No rashes, lesions or ulcers Psychiatry: Judgement and insight appear impaired. Mood & affect pleasant.     Data Reviewed: I have personally reviewed following labs and imaging studies  CBC: Recent Labs  Lab 12/20/18 1939 12/21/18 0321 12/22/18 0118 12/23/18 6063 12/24/18 0326 12/25/18 0529  WBC 13.1* 10.4 9.9 12.6* 12.2* 7.6  NEUTROABS 10.6*  --   --   --   --   --   HGB 16.2* 14.0 12.5 13.2 11.0* 11.4*  HCT 47.7* 40.6 38.6 40.8 33.8* 34.0*  MCV 92.1 90.8 92.1 92.3 93.4 92.6  PLT 310 227 241 257 246 016   Basic Metabolic Panel: Recent Labs  Lab 12/22/18 0118 12/22/18 1632 12/23/18 0321 12/24/18 0326 12/25/18 0529  NA 134* 138 135 138 136  K 3.1* 3.7 4.6 3.5 3.2*  CL 103 105 106 106 104  CO2 25 22 19* 23 26  GLUCOSE 108* 107* 137* 183* 160*  BUN 52* 38* 46* 34* 21  CREATININE 1.09* 0.90 1.19* 0.78 0.63  CALCIUM 10.4* 10.5* 10.1 10.0 9.5  MG 1.8  --   --   --   --    Liver Function Tests: Recent Labs  Lab 12/20/18 2039 12/24/18 0326 12/25/18 0529  AST 22 36 30  ALT 12 18 19   ALKPHOS 80 70 63  BILITOT 3.5* 2.6* 1.6*  PROT 6.9 5.0* 5.1*  ALBUMIN 3.1* 1.6* 1.6*     Recent Results (from the past 240 hour(s))  Blood culture (routine x 2)     Status: None   Collection Time: 12/20/18 10:36 PM  Result Value Ref Range Status   Specimen Description SITE NOT SPECIFIED  Final   Special Requests   Final    BOTTLES DRAWN AEROBIC AND ANAEROBIC Blood Culture results may not be optimal due to an  inadequate volume of blood received in culture bottles   Culture   Final    NO GROWTH 5 DAYS Performed at [facility], [address], [place], [place] [address]    Report Status 12/25/2018 FINAL  Final  Blood culture (routine x 2)     Status: None   Collection Time: 12/20/18 10:38 PM  Result Value Ref Range Status   Specimen Description BLOOD LEFT ANTECUBITAL  Final   Special Requests   Final    BOTTLES DRAWN AEROBIC AND ANAEROBIC Blood Culture results may not be optimal due to an inadequate volume of blood received in culture bottles   Culture   Final    NO GROWTH 5 DAYS Performed at [facility], [place] [address] [place], [place] [address]    Report Status 12/25/2018 FINAL  Final  Surgical  pcr screen     Status: Abnormal   Collection Time: 12/22/18  8:46 AM  Result Value Ref Range Status   MRSA, PCR NEGATIVE NEGATIVE Final   Staphylococcus aureus POSITIVE (A) NEGATIVE Final    Comment: (NOTE) The Xpert SA Assay (FDA approved for NASAL specimens in patients 26 years of age and older), is one component of a comprehensive surveillance program. It is not intended to diagnose infection nor to guide or monitor treatment. Performed at [facility], [place] [address], [place], [place] [address]          Radiology Studies: No results found.      Scheduled Meds: . enoxaparin (LOVENOX) injection  40 mg Subcutaneous Q24H  . fluticasone  1 spray Each Nare Daily   Continuous Infusions: . dextrose 5 % and 0.45 % NaCl with KCl 10 mEq/L 125 mL/hr at 12/25/18 0200  . piperacillin-tazobactam (ZOSYN)  IV 3.375 g (12/25/18 1249)     LOS: 4 days     Vernell Leep, MD, FACP, [facility]. [facility]  To contact the attending provider between 7A-7P or the covering provider during after hours 7P-7A, please log into the web site [email or web address] and access using universal [place] password for that web site. If you do not have the password, please call the hospital  operator.  12/25/2018, 3:33 PM

## 2018-12-25 NOTE — TOC Initial Note (Signed)
Transition of Care [facility]) - Initial/Assessment Note    Patient Details  Name: Felicia Stephens MRN: [id number] Date of Birth: October 20, 1932  Transition of [facility]) CM/SW Contact:    Ninfa Meeker, RN Phone Number: 12/25/2018, 2:36 PM  Clinical Narrative:   85 yr old female admitted with Ventral Hernia with bowel obstruction. 12/22/18 Patient underwent exploratory laparotomy with small bowel resection and primary hernia repair.   Case manager spoke with patient's daughters, Manuela Schwartz and Angelita Ingles concerning discharge plan when patient is medically ready. Choice was offered for [facility] agency. Angelita Ingles says she lives with her parents and has used [facility] in the past with her dad. She requests that I contact them. They have 3in1 and will only need a RW. Angelita Ingles says the patient has  great family support.   Patient Goals and CMS Choice   CMS Medicare.gov Compare Post Acute Care list provided to:: Patient Represenative (must comment)(spoke with patient's daughter Angelita Ingles) Choice offered to / list presented to : Adult Children  Expected Discharge Plan and Services Expected Discharge Plan: [place]   Discharge Planning Services: CM Consult Post Acute Care Choice: Durable Medical Equipment, Home Health Living arrangements for the past 2 months: Single Family Home                          Prior Living Arrangements/Services Living arrangements for the past 2 months: Single Family Home Lives with:: Adult Children, Spouse              Current home services: DME    Activities of Daily Living      Permission Sought/Granted                  Emotional Assessment              Admission diagnosis:  Small bowel obstruction ([place]) [K56.609] Nasogastric tube present [Z97.8] Patient Active Problem List   Diagnosis Date Noted  . Ventral hernia with bowel obstruction 12/21/2018  . HTN (hypertension) 12/21/2018   PCP:  Lajean Manes, MD Pharmacy:    [id number] - [place], [place]. AT [address] [place] [address] Phone: [phone] Fax: [phone]     Social Determinants of Health (SDOH) Interventions    Readmission Risk Interventions No flowsheet data found.

## 2018-12-25 NOTE — Evaluation (Signed)
Physical Therapy Evaluation Patient Details Name: Felicia Stephens MRN: [id number] DOB: [birth year] Today's Date: 12/25/2018   History of Present Illness  Patient is a 85 y/o female who presents with abdominal pain, and N/V, found to have Incarcerated ventral hernia s/p primary hernia repair with small bowel repair 4/17. PMH includes HTN, HLD.  Clinical Impression  Patient presents with confusion, pain, impaired balance, lethargy and impaired mobility s/p above. Pt confused this AM and not able to provide PLOF/history. Per nurse, pt was ambulatory PTA and lives with spouse/daughter. Pt oriented to self only and thinks her spouse is deceased. Requires max cues/assist for sequencing during bed mobility and transfers. Tolerated standing with assist of 2 and taking a few steps to get to a chair. Pt seems clearer once up and sitting in chair. Tangential with speech and poor awareness of why she is in the hospital. If pt has sufficient assist at home, should be safe to d/c home with [facility] services. If she does not have hands on assist/supervision, will likely need SNF. Will follow acutely to maximize independence and mobility prior to return home.    Follow Up Recommendations Home health PT;Supervision for mobility/OOB    Equipment Recommendations  Rolling walker with 5" wheels(if she does not have one)    Recommendations for Other Services       Precautions / Restrictions Precautions Precautions: Fall Precaution Comments: NG tube clamped; JP drain Restrictions Weight Bearing Restrictions: No      Mobility  Bed Mobility Overal bed mobility: Needs Assistance Bed Mobility: Rolling;Sidelying to Sit Rolling: Max assist Sidelying to sit: Max assist;HOB elevated       General bed mobility comments: Step by step cues to roll onto left side, bring LEs off bed and elevate trunk; difficulty sequencing movements.   Transfers Overall transfer level: Needs assistance Equipment used: Rolling walker (2  wheeled) Transfers: Sit to/from Stand Sit to Stand: Mod assist;+2 physical assistance         General transfer comment: Assist to power to standing with cues for hand placement as pt pulling up on RW. Needs manual assist to place hands on correct place.   Ambulation/Gait Ambulation/Gait assistance: Min assist;+2 physical assistance Gait Distance (Feet): 3 Feet Assistive device: Rolling walker (2 wheeled)   Gait velocity: decreased   General Gait Details: Able to take a few steps to get to chair with MIn A for balance and RW management.   Stairs            Wheelchair Mobility    Modified Rankin (Stroke Patients Only)       Balance Overall balance assessment: Needs assistance Sitting-balance support: Feet supported;No upper extremity supported Sitting balance-Leahy Scale: Fair Sitting balance - Comments: Able to sit EOB with no UE support; posterior lean noted but no LOB. Postural control: Posterior lean Standing balance support: During functional activity Standing balance-Leahy Scale: Poor Standing balance comment: Requires BUE support in standing.                              Pertinent Vitals/Pain Pain Assessment: Faces Faces Pain Scale: Hurts little more Pain Location: abdomen during mobility Pain Descriptors / Indicators: Sore;Grimacing;Guarding Pain Intervention(s): Monitored during session    Home Living Family/patient expects to be discharged to:: Private residence Living Arrangements: Children;Spouse/significant other(daughter) Available Help at Discharge: Family;Available 24 hours/day             Additional Comments: Pt not able to  state PLOF/history secondary to confusion.    Prior Function Level of Independence: Independent         Comments: Reports being independent and driving PTA??     Hand Dominance        Extremity/Trunk Assessment   Upper Extremity Assessment Upper Extremity Assessment: Defer to OT evaluation     Lower Extremity Assessment Lower Extremity Assessment: Generalized weakness    Cervical / Trunk Assessment Cervical / Trunk Assessment: Kyphotic  Communication   Communication: HOH  Cognition Arousal/Alertness: Lethargic;Awake/alert Behavior During Therapy: WFL for tasks assessed/performed Overall Cognitive Status: Impaired/Different from baseline Area of Impairment: Orientation;Attention;Memory;Following commands;Awareness;Problem solving;Safety/judgement                 Orientation Level: Disoriented to;Place;Time;Situation Current Attention Level: Sustained Memory: Decreased short-term memory Following Commands: Follows one step commands with increased time Safety/Judgement: Decreased awareness of deficits Awareness: Intellectual Problem Solving: Slow processing;Decreased initiation;Requires verbal cues;Requires tactile cues;Difficulty sequencing General Comments: "[facility] "I am here because someone else had pain and if you press the buttons they turn into flowers." Oriented to self and bday only. Tangential in speech. More awake and clearer once sitting upright. States her husband is dead but per nurse he is not.      General Comments      Exercises     Assessment/Plan    PT Assessment Patient needs continued PT services  PT Problem List Decreased strength;Decreased balance;Decreased cognition;Pain;Decreased knowledge of use of DME;Decreased mobility;Decreased safety awareness;Decreased skin integrity       PT Treatment Interventions Functional mobility training;Balance training;Patient/family education;Gait training;Therapeutic activities;Stair training;Therapeutic exercise;DME instruction;Cognitive remediation    PT Goals (Current goals can be found in the Care Plan section)  Acute Rehab PT Goals Patient Stated Goal: none stated PT Goal Formulation: Patient unable to participate in goal setting Time For Goal Achievement: 01/08/19 Potential  to Achieve Goals: Fair    Frequency Min 3X/week   Barriers to discharge   not sure of level of support at home or stairs?    Co-evaluation               AM-PAC PT "6 Clicks" Mobility  Outcome Measure Help needed turning from your back to your side while in a flat bed without using bedrails?: A Lot Help needed moving from lying on your back to sitting on the side of a flat bed without using bedrails?: Total Help needed moving to and from a bed to a chair (including a wheelchair)?: A Lot Help needed standing up from a chair using your arms (e.g., wheelchair or bedside chair)?: A Lot Help needed to walk in hospital room?: A Lot Help needed climbing 3-5 steps with a railing? : Total 6 Click Score: 10    End of Session Equipment Utilized During Treatment: Gait belt Activity Tolerance: Patient tolerated treatment well Patient left: in chair;with call bell/phone within reach;with chair alarm set;with nursing/sitter in room Nurse Communication: Mobility status PT Visit Diagnosis: Pain;Difficulty in walking, not elsewhere classified (R26.2);Muscle weakness (generalized) (M62.81);Unsteadiness on feet (R26.81) Pain - part of body: (abdomen)    Time: 9604-5409 PT Time Calculation (min) (ACUTE ONLY): 22 min   Charges:   PT Evaluation $PT Eval Moderate Complexity: 1 Mod          Wray Kearns, PT, DPT Acute Rehabilitation Services Pager [phone] Office [phone]      Marguarite Arbour A Sabra Heck 12/25/2018, 11:32 AM

## 2018-12-25 NOTE — Progress Notes (Signed)
Pharmacy Antibiotic Note  Felicia Stephens is a 85 y.o. female admitted on 12/20/2018 with intraabdominal infection.  Pharmacy has been consulted for Zosyn dosing.  Day 4 of Zosyn therapy. Blood culture no growth in 4 days. Renal function stable.  Plan: Continue Zosyn 3.375g IV q8h (4 hour infusion).  Monitor renal function and C&S.  Height: 5' 3.5" (161.3 cm) Weight: 160 lb (72.6 kg) IBW/kg (Calculated) : 53.55  Temp (24hrs), Avg:97.8 F (36.6 C), Min:97.5 F (36.4 C), Max:98.3 F (36.8 C)  Recent Labs  Lab 12/20/18 2039 12/20/18 2245 12/21/18 0321 12/22/18 0118 12/22/18 1632 12/23/18 0321 12/23/18 0633 12/24/18 0326 12/25/18 0529  WBC  --   --  10.4 9.9  --   --  12.6* 12.2* 7.6  CREATININE 2.24*  --  2.05* 1.09* 0.90 1.19*  --  0.78 0.63  LATICACIDVEN 2.3* 2.1*  --   --   --   --   --   --   --     Estimated Creatinine Clearance: 51.5 mL/min (by C-G formula based on SCr of 0.63 mg/dL).   No Known Allergies  Antimicrobials this admission: Zosyn 4/17 >>   Thank you for allowing pharmacy to be a part of this patient's care.  Alanda Slim, PharmD, [facility] Clinical Pharmacist Please see AMION for all Pharmacists' Contact Phone Numbers 12/25/2018, 10:06 AM

## 2018-12-25 NOTE — Care Management Important Message (Signed)
Important Message  Patient Details  Name: Felicia Stephens MRN: [id number] Date of Birth: 25-Jun-1933   Medicare Important Message Given:  Yes    Orbie Pyo 12/25/2018, 3:56 PM

## 2018-12-25 NOTE — Progress Notes (Signed)
[facility] Progress Note  3 Days Post-Op  Subjective: CC: delirium Patient confused and repetitive but able to tell me abdominal pain is not too bad at the moment. Unsure if she has passed gas. Per RN no wound care orders in place and patient has not been up since surgery.   Objective: Vital signs in last 24 hours: Temp:  [97.5 F (36.4 C)-98.3 F (36.8 C)] 98.3 F (36.8 C) (04/20 0322) Pulse Rate:  [72-87] 72 (04/20 0322) Resp:  [16] 16 (04/19 1234) BP: (141-143)/(59-64) 141/59 (04/20 0322) SpO2:  [95 %-98 %] 98 % (04/20 0322) Last BM Date: 12/21/18  Intake/Output from previous day: 04/19 0701 - 04/20 0700 In: 4047.9 [I.V.:3897.9; IV Piggyback:150] Out: 2610 [Urine:950; Emesis/NG output:1650; Drains:10] Intake/Output this shift: No intake/output data recorded.  PE: Gen:  Alert, NAD, pleasant Card:  Regular rate and rhythm Pulm:  Normal effort, clear to auscultation bilaterally Abd: Soft, appropriately tender, non-distended, bowel sounds hypoactive, midline wound clean with significant leftward undermining, drain with SS fluid GU: foley present Skin: warm and dry, no rashes    Lab Results:  Recent Labs    12/24/18 0326 12/25/18 0529  WBC 12.2* 7.6  HGB 11.0* 11.4*  HCT 33.8* 34.0*  PLT 246 258   BMET Recent Labs    12/24/18 0326 12/25/18 0529  NA 138 136  K 3.5 3.2*  CL 106 104  CO2 23 26  GLUCOSE 183* 160*  BUN 34* 21  CREATININE 0.78 0.63  CALCIUM 10.0 9.5   PT/INR No results for input(s): LABPROT, INR in the last 72 hours. CMP     Component Value Date/Time   NA 136 12/25/2018 0529   K 3.2 (L) 12/25/2018 0529   CL 104 12/25/2018 0529   CO2 26 12/25/2018 0529   GLUCOSE 160 (H) 12/25/2018 0529   BUN 21 12/25/2018 0529   CREATININE 0.63 12/25/2018 0529   CALCIUM 9.5 12/25/2018 0529   PROT 5.1 (L) 12/25/2018 0529   ALBUMIN 1.6 (L) 12/25/2018 0529   AST 30 12/25/2018 0529   ALT 19 12/25/2018 0529   ALKPHOS 63 12/25/2018 0529   BILITOT 1.6 (H) 12/25/2018 0529   GFRNONAA >60 12/25/2018 0529   GFRAA >60 12/25/2018 0529   Lipase     Component Value Date/Time   LIPASE 23 12/20/2018 2039       Studies/Results: No results found.  Anti-infectives: Anti-infectives (From admission, onward)   Start     Dose/Rate Route Frequency Ordered Stop   12/22/18 1400  piperacillin-tazobactam (ZOSYN) IVPB 3.375 g     3.375 g 12.5 mL/hr over 240 Minutes Intravenous Every 8 hours 12/22/18 1332     12/22/18 1100  cefoTEtan in Dextrose 5% (CEFOTAN) IVPB 2 g  Status:  Discontinued     2 g 100 mL/hr over 30 Minutes Intravenous Every 12 hours 12/22/18 1047 12/22/18 1315   12/20/18 2130  piperacillin-tazobactam (ZOSYN) IVPB 3.375 g     3.375 g 100 mL/hr over 30 Minutes Intravenous  Once 12/20/18 2124 12/20/18 2329       Assessment/Plan HTN HLD ABL amemia - H/H 11.4/34, stable Acute metabolic encephalopathy AKI - Cr 0.63, improving Hypokalemia - K 3.2, replacement per primary  Hyperbilirubinemia - improving, Tbili 1.6 from 2.6  Incarcerated ventral hernia - s/p primary hernia repair with SBRx1 Dr. Rosendo Gros 12/22/18 - POD#3 - d/c foley - BID dressing changes - trial of NGT clamp and allow sips - PT/OT  FEN: NGT clamp, IVF, sips VTE: SCDs, lovenox  ID: Zosyn 4/15>>  LOS: 4 days    Brigid Re , [facility] Surgery 12/25/2018, 8:13 AM Pager: [phone] Consults: [phone]

## 2018-12-26 ENCOUNTER — Inpatient Hospital Stay (HOSPITAL_COMMUNITY): Payer: Medicare Other

## 2018-12-26 LAB — CBC
HCT: 35.1 % — ABNORMAL LOW (ref 36.0–46.0)
Hemoglobin: 11.4 g/dL — ABNORMAL LOW (ref 12.0–15.0)
MCH: 30 pg (ref 26.0–34.0)
MCHC: 32.5 g/dL (ref 30.0–36.0)
MCV: 92.4 fL (ref 80.0–100.0)
Platelets: 242 10*3/uL (ref 150–400)
RBC: 3.8 MIL/uL — ABNORMAL LOW (ref 3.87–5.11)
RDW: 12.2 % (ref 11.5–15.5)
WBC: 7.3 10*3/uL (ref 4.0–10.5)
nRBC: 0 % (ref 0.0–0.2)

## 2018-12-26 LAB — BASIC METABOLIC PANEL
Anion gap: 8 (ref 5–15)
BUN: 16 mg/dL (ref 8–23)
CO2: 22 mmol/L (ref 22–32)
Calcium: 9.1 mg/dL (ref 8.9–10.3)
Chloride: 107 mmol/L (ref 98–111)
Creatinine, Ser: 0.69 mg/dL (ref 0.44–1.00)
GFR calc Af Amer: >60 mL/min (ref >60)
GFR calc non Af Amer: >60 mL/min (ref >60)
Glucose, Bld: 125 mg/dL — ABNORMAL HIGH (ref 70–99)
Potassium: 4.1 mmol/L (ref 3.5–5.1)
Sodium: 137 mmol/L (ref 135–145)

## 2018-12-26 MED ORDER — BOOST / RESOURCE BREEZE PO LIQD CUSTOM
1.0000 | Freq: Three times a day (TID) | ORAL | Status: DC
  Administered 2018-12-26 – 2018-12-27 (×2): 1 via ORAL

## 2018-12-26 MED ORDER — ENSURE ENLIVE PO LIQD
237.0000 mL | Freq: Two times a day (BID) | ORAL | Status: DC
  Administered 2018-12-26 – 2018-12-29 (×6): 237 mL via ORAL

## 2018-12-26 MED ORDER — HALOPERIDOL LACTATE 5 MG/ML IJ SOLN: 2.0000 mg | Freq: Four times a day (QID) | INTRAMUSCULAR | Status: DC | PRN

## 2018-12-26 MED ORDER — WHITE PETROLATUM EX OINT
TOPICAL_OINTMENT | CUTANEOUS | Status: AC
  Administered 2018-12-26: 0.2
  Filled 2018-12-26: qty 28.35

## 2018-12-26 MED ORDER — ACETAMINOPHEN 650 MG RE SUPP: 650.0000 mg | Freq: Three times a day (TID) | RECTAL | Status: DC

## 2018-12-26 MED ORDER — METHOCARBAMOL 500 MG PO TABS: 500.0000 mg | ORAL_TABLET | Freq: Three times a day (TID) | ORAL | Status: DC | PRN

## 2018-12-26 MED ORDER — ACETAMINOPHEN 500 MG PO TABS
1000.0000 mg | ORAL_TABLET | Freq: Three times a day (TID) | ORAL | Status: DC
  Administered 2018-12-26: 1000 mg via ORAL
  Filled 2018-12-26 (×3): qty 2

## 2018-12-26 NOTE — Progress Notes (Signed)
Physical Therapy Treatment Patient Details Name: Felicia Stephens MRN: [id number] DOB: 1933/01/26 Today's Date: 12/26/2018    History of Present Illness Patient is a 85 y/o female who presents with abdominal pain, and N/V, found to have Incarcerated ventral hernia s/p primary hernia repair with small bowel repair 4/17. PMH includes HTN, HLD.    PT Comments    Pt not following commands today and very confused and limited by desire to touch or not touch certain colors. Hoping that once she is home in her own environment with her family that she will be more responsive and will work with [facility]. Though she is SNF level, if her family can provide care, she will likely do much better at home. PT will continue to follow.    Follow Up Recommendations  Home health PT;Supervision for mobility/OOB     Equipment Recommendations  Rolling walker with 5" wheels(if she does not have one)    Recommendations for Other Services       Precautions / Restrictions Precautions Precautions: Fall Precaution Comments: NG tube clamped; JP drain Restrictions Weight Bearing Restrictions: No    Mobility  Bed Mobility Overal bed mobility: Needs Assistance Bed Mobility: Rolling;Sidelying to Sit Rolling: Max assist Sidelying to sit: Max assist;HOB elevated       General bed mobility comments: pt needed max A for iniation of mvmt, she assisted with trunk elevation once LE's were off the bed.   Transfers Overall transfer level: Needs assistance Equipment used: Rolling walker (2 wheeled) Transfers: Sit to/from [facility] Sit to Stand: Mod assist Stand pivot transfers: Mod assist       General transfer comment: pt would not stand with vc's. Manual facilitation of fwd wt shift led to pt taking wt through feet and standing with mod A for power up. Once up, pt would only hold RW with R hand so therapist has to move L side of RW as pt pivoted to chair. Pt had to be assured that she was sitting on  white linens before she would sit in chair.   Ambulation/Gait             General Gait Details: could not walk today due to pt not being willing to hold RW and unsteady on feet   Stairs             Wheelchair Mobility    Modified Rankin (Stroke Patients Only)       Balance Overall balance assessment: Needs assistance Sitting-balance support: Feet supported;No upper extremity supported Sitting balance-Leahy Scale: Fair Sitting balance - Comments: Able to sit EOB with no UE support; posterior lean noted but no LOB. Postural control: Posterior lean Standing balance support: During functional activity Standing balance-Leahy Scale: Poor Standing balance comment: Requires BUE support in standing.                             Cognition Arousal/Alertness: Awake/alert Behavior During Therapy: WFL for tasks assessed/performed Overall Cognitive Status: Impaired/Different from baseline Area of Impairment: Orientation;Attention;Memory;Following commands;Awareness;Problem solving;Safety/judgement                 Orientation Level: Disoriented to;Place;Time;Situation Current Attention Level: Focused Memory: Decreased short-term memory Following Commands: Follows one step commands with increased time Safety/Judgement: Decreased awareness of deficits Awareness: Intellectual Problem Solving: Slow processing;Decreased initiation;Requires verbal cues;Requires tactile cues;Difficulty sequencing General Comments: pt said that she didn't like orange, brown, or gray so she would not hold handle of  RW with L hand. She also would not put her R foot on the floor with the sock on, could put it down with sock off. Was not following commands well.       Exercises General Exercises - Lower Extremity Ankle Circles/Pumps: AROM;Both;10 reps;Supine    General Comments General comments (skin integrity, edema, etc.): attempted LE ther ex but pt not following commands to  perform      Pertinent Vitals/Pain Pain Assessment: Faces Faces Pain Scale: Hurts little more Pain Location: abdomen during mobility Pain Descriptors / Indicators: Sore;Grimacing;Guarding Pain Intervention(s): Limited activity within patient's tolerance;Monitored during session    Home Living                      Prior Function            PT Goals (current goals can now be found in the care plan section) Acute Rehab PT Goals Patient Stated Goal: none stated PT Goal Formulation: Patient unable to participate in goal setting Time For Goal Achievement: 01/08/19 Potential to Achieve Goals: Fair Progress towards PT goals: Progressing toward goals    Frequency    Min 3X/week      PT Plan Current plan remains appropriate    Co-evaluation              AM-PAC PT "6 Clicks" Mobility   Outcome Measure  Help needed turning from your back to your side while in a flat bed without using bedrails?: A Lot Help needed moving from lying on your back to sitting on the side of a flat bed without using bedrails?: Total Help needed moving to and from a bed to a chair (including a wheelchair)?: A Lot Help needed standing up from a chair using your arms (e.g., wheelchair or bedside chair)?: A Lot Help needed to walk in hospital room?: Total Help needed climbing 3-5 steps with a railing? : Total 6 Click Score: 9    End of Session Equipment Utilized During Treatment: Gait belt Activity Tolerance: Other (comment)(limited by cognition) Patient left: in chair;with call bell/phone within reach;with chair alarm set Nurse Communication: Mobility status PT Visit Diagnosis: Pain;Difficulty in walking, not elsewhere classified (R26.2);Muscle weakness (generalized) (M62.81);Unsteadiness on feet (R26.81) Pain - part of body: (abdomen)     Time: 1914-7829 PT Time Calculation (min) (ACUTE ONLY): 21 min  Charges:  $Therapeutic Activity: 8-22 mins                     Leighton Roach, [place]  Pager [phone] Office [place] 12/26/2018, 9:51 AM

## 2018-12-26 NOTE — Progress Notes (Signed)
PROGRESS NOTE    Felicia Stephens  QJJ:[id number] DOB: [birth year] DOA: 12/20/2018 PCP: Lajean Manes, MD  Brief Narrative:  85 year old female with PMH of HLD, HTN, large ventral incisional hernia presented to [facility] ED on 4/15 due to 3 days history of acute onset of abdominal pain, nausea and vomiting.  She was initially admitted for SBO due to large hernia.  Failed conservative management including bowel rest and NG tube.  On 4/17, general surgery performed emergent exploratory laparotomy, small bowel resection and hernia repair for incarcerated ventral hernia with ischemic bowel.  Ongoing postop ileus.  Mobilizing.  Awaiting return of bowel function to advance diet.   Assessment & Plan:   Principal Problem:   Ventral hernia with bowel obstruction Active Problems:   HTN (hypertension)  Better ventral hernia with ischemic bowel Failed out conservative measures Patient underwent primary hernia repair with small bowel resection on 4/17. Surgery on board and appreciate their assistance. Continue with n.p.o., NG tube tube and IV fluids.   Acute metabolic encephalopathy Multifactorial due to acute medical illness including AKI, sleep deprivation postop delirium, hospital delirium, pain and opioid use.  As per the family patient does not have a history of dementia. We will get MRI of the brain for further evaluation.    Hypertension Well-controlled   Dehydration and hyponatremia Have improved    Acute blood loss early anemia Secondary to postop blood loss. Infuse to keep hemoglobin greater than 7.  AKI Resolved with hydration.   Hyperlipidemia Holding statins as patient is n.p.o.       DVT prophylaxis: SCDs Code Status: Full code Family Communication: None at bedside. Spoke to daughter over the phone.  Disposition Plan: Pending return of bowel function and resolution of acute encephalopathy   Consultants:   Surgery  Gastroenterology   Procedures: Hernia repair  with small bowel section  Antimicrobials: Zosyn  Subjective: Confused  Objective: Vitals:   12/25/18 1433 12/25/18 2136 12/26/18 0445 12/26/18 1457  BP: (!) 168/67 (!) 153/70 (!) 155/73 (!) 146/61  Pulse: 77 83 77 91  Resp: 18 16 16 20   Temp: 98.5 F (36.9 C) 97.6 F (36.4 C) 98.2 F (36.8 C) 97.8 F (36.6 C)  TempSrc: Oral Oral Oral Oral  SpO2: 100% 94% 98% 100%  Weight:      Height:        Intake/Output Summary (Last 24 hours) at 12/26/2018 1541 Last data filed at 12/26/2018 1500 Gross per 24 hour  Intake 1720.09 ml  Output 425 ml  Net 1295.09 ml   Filed Weights   12/22/18 0946  Weight: 72.6 kg    Examination:  General exam: Appears calm and comfortable  Respiratory system: Clear to auscultation. Respiratory effort normal. Cardiovascular system: S1 & S2 heard, RRR.  Gastrointestinal system: Abdomen is soft, tender, bowel sounds are good  Central nervous system: Alert and pleasantly confused, able to move all extremities, no focal deficits Extremities: Symmetric 5 x 5 power. Skin: No rashes, lesions or ulcers Psychiatry: Pleasantly confused    Data Reviewed: I have personally reviewed following labs and imaging studies  CBC: Recent Labs  Lab 12/20/18 1939  12/22/18 0118 12/23/18 4818 12/24/18 0326 12/25/18 0529 12/26/18 0330  WBC 13.1*   < > 9.9 12.6* 12.2* 7.6 7.3  NEUTROABS 10.6*  --   --   --   --   --   --   HGB 16.2*   < > 12.5 13.2 11.0* 11.4* 11.4*  HCT 47.7*   < >  38.6 40.8 33.8* 34.0* 35.1*  MCV 92.1   < > 92.1 92.3 93.4 92.6 92.4  PLT 310   < > 241 257 246 258 242   < > = values in this interval not displayed.   Basic Metabolic Panel: Recent Labs  Lab 12/22/18 0118 12/22/18 1632 12/23/18 0321 12/24/18 0326 12/25/18 0529 12/26/18 0330  NA 134* 138 135 138 136 137  K 3.1* 3.7 4.6 3.5 3.2* 4.1  CL 103 105 106 106 104 107  CO2 25 22 19* 23 26 22   GLUCOSE 108* 107* 137* 183* 160* 125*  BUN 52* 38* 46* 34* 21 16  CREATININE 1.09*  0.90 1.19* 0.78 0.63 0.69  CALCIUM 10.4* 10.5* 10.1 10.0 9.5 9.1  MG 1.8  --   --   --   --   --    GFR: Estimated Creatinine Clearance: 51.5 mL/min (by C-G formula based on SCr of 0.69 mg/dL). Liver Function Tests: Recent Labs  Lab 12/20/18 2039 12/24/18 0326 12/25/18 0529  AST 22 36 30  ALT 12 18 19   ALKPHOS 80 70 63  BILITOT 3.5* 2.6* 1.6*  PROT 6.9 5.0* 5.1*  ALBUMIN 3.1* 1.6* 1.6*   Recent Labs  Lab 12/20/18 2039  LIPASE 23   No results for input(s): AMMONIA in the last 168 hours. Coagulation Profile: No results for input(s): INR, PROTIME in the last 168 hours. Cardiac Enzymes: No results for input(s): CKTOTAL, CKMB, CKMBINDEX, TROPONINI in the last 168 hours. BNP (last 3 results) No results for input(s): PROBNP in the last 8760 hours. HbA1C: No results for input(s): HGBA1C in the last 72 hours. CBG: No results for input(s): GLUCAP in the last 168 hours. Lipid Profile: No results for input(s): CHOL, HDL, LDLCALC, TRIG, CHOLHDL, LDLDIRECT in the last 72 hours. Thyroid Function Tests: No results for input(s): TSH, T4TOTAL, FREET4, T3FREE, THYROIDAB in the last 72 hours. Anemia Panel: No results for input(s): VITAMINB12, FOLATE, FERRITIN, TIBC, IRON, RETICCTPCT in the last 72 hours. Sepsis Labs: Recent Labs  Lab 12/20/18 2039 12/20/18 2245  LATICACIDVEN 2.3* 2.1*    Recent Results (from the past 240 hour(s))  Blood culture (routine x 2)     Status: None   Collection Time: 12/20/18 10:36 PM  Result Value Ref Range Status   Specimen Description SITE NOT SPECIFIED  Final   Special Requests   Final    BOTTLES DRAWN AEROBIC AND ANAEROBIC Blood Culture results may not be optimal due to an inadequate volume of blood received in culture bottles   Culture   Final    NO GROWTH 5 DAYS Performed at [facility], [place] [address], [place], [place] [address]    Report Status 12/25/2018 FINAL  Final  Blood culture (routine x 2)     Status: None   Collection Time:  12/20/18 10:38 PM  Result Value Ref Range Status   Specimen Description BLOOD LEFT ANTECUBITAL  Final   Special Requests   Final    BOTTLES DRAWN AEROBIC AND ANAEROBIC Blood Culture results may not be optimal due to an inadequate volume of blood received in culture bottles   Culture   Final    NO GROWTH 5 DAYS Performed at [facility], [place] [address], [place], [place] [address]    Report Status 12/25/2018 FINAL  Final  Surgical pcr screen     Status: Abnormal   Collection Time: 12/22/18  8:46 AM  Result Value Ref Range Status   MRSA, PCR NEGATIVE  NEGATIVE Final   Staphylococcus aureus POSITIVE (A) NEGATIVE Final    Comment: (NOTE) The Xpert SA Assay (FDA approved for NASAL specimens in patients 15 years of age and older), is one component of a comprehensive surveillance program. It is not intended to diagnose infection nor to guide or monitor treatment. Performed at [facility], [place] [address] [place], [place] [address]          Radiology Studies: No results found.      Scheduled Meds: . acetaminophen  1,000 mg Oral Q8H   Or  . acetaminophen  650 mg Rectal Q8H  . Chlorhexidine Gluconate Cloth  6 each Topical Daily  . enoxaparin (LOVENOX) injection  40 mg Subcutaneous Q24H  . feeding supplement  1 Container Oral TID BM  . feeding supplement (ENSURE ENLIVE)  237 mL Oral BID BM  . fluticasone  1 spray Each Nare Daily  . mupirocin ointment  1 application Nasal BID   Continuous Infusions: . dextrose 5 % and 0.9 % NaCl with KCl 40 mEq/L 75 mL/hr at 12/26/18 1500  . piperacillin-tazobactam (ZOSYN)  IV 3.375 g (12/26/18 1256)     LOS: 5 days    Time spent: 35 minutes    Hosie Poisson, MD [facility] Pager [phone]  If 7PM-7AM, please contact night-coverage [email or web address] Password [facility] 12/26/2018, 3:41 PM

## 2018-12-26 NOTE — Progress Notes (Signed)
Pt. Becoming increasingly confused. Has referred to several staff members including this RN as the "devil". Pt. Will not take anything by mouth at this time. Brigid Re, PA and Karleen Hampshire, MD made aware.

## 2018-12-26 NOTE — Progress Notes (Signed)
Pt. Refused to take Tramadol, but will not give this RN or the charge nurse Sharyn Lull back the pill. Will continue to try to get this pill from the patient, but she is currently refusing.

## 2018-12-26 NOTE — Progress Notes (Signed)
SOCIAL VISIT--  Pt known to me from outpt colonoscopy, has not had previous chronic GI problems.  Pt seen yest at request of family.  Surg mgt much appreciated.  Op rept and path repts reviewed.  Length of resected bowel 30 cm so pt shouldn't suffer from short-bowel syndrome or steatorrhea from ileal resection. (Although path rept only indicates small intestine as specimen, from op rept it sounds as though prox colon may have been resected, so pt may experience post-op diarr from loss of I-C valve and/or loss of ileal "brake.")  Please call me if there are any GI issues that I can help with.  Cleotis Nipper, M.D. Pager [phone] If no answer or after 5 PM call [phone]

## 2018-12-26 NOTE — Progress Notes (Signed)
PROGRESS NOTE    Felicia Stephens  LFY:[id number] DOB: [birth year] DOA: 12/20/2018 PCP: Lajean Manes, MD  Brief Narrative:  85 year old female with PMH of HLD, HTN, large ventral incisional hernia presented to [facility] ED on 4/15 due to 3 days history of acute onset of abdominal pain, nausea and vomiting.  She was initially admitted for SBO due to large hernia.  Failed conservative management including bowel rest and NG tube.  On 4/17, general surgery performed emergent exploratory laparotomy, small bowel resection and hernia repair for incarcerated ventral hernia with ischemic bowel.  Ongoing postop ileus.  Mobilizing.  Awaiting return of bowel function to advance diet.   Assessment & Plan:   Principal Problem:   Ventral hernia with bowel obstruction Active Problems:   HTN (hypertension)  Better ventral hernia with ischemic bowel Failed out conservative measures Patient underwent primary hernia repair with small bowel resection on 4/17. Surgery on board and appreciate their assistance. Continue with n.p.o., NG tube tube and IV fluids.   Acute metabolic encephalopathy Multifactorial due to acute medical illness including AKI, sleep deprivation postop delirium, hospital delirium, pain and opioid use.  As per the family patient does not have a history of dementia. We will get MRI of the brain for further evaluation.    Hypertension Well-controlled   Dehydration and hyponatremia Have improved    Acute blood loss early anemia Secondary to postop blood loss. Infuse to keep hemoglobin greater than 7.  AKI Resolved with hydration.   Hyperlipidemia Holding statins as patient is n.p.o.       DVT prophylaxis: lovenox Code Status: Full code Family Communication: None at bedside. Spoke to daughter over the phone.  Disposition Plan: Pending return of bowel function and resolution of acute encephalopathy   Consultants:   Surgery  Gastroenterology   Procedures: Hernia  repair with small bowel section  Antimicrobials: Zosyn  Subjective: Confused  Objective: Vitals:   12/25/18 1433 12/25/18 2136 12/26/18 0445 12/26/18 1457  BP: (!) 168/67 (!) 153/70 (!) 155/73 (!) 146/61  Pulse: 77 83 77 91  Resp: 18 16 16 20   Temp: 98.5 F (36.9 C) 97.6 F (36.4 C) 98.2 F (36.8 C) 97.8 F (36.6 C)  TempSrc: Oral Oral Oral Oral  SpO2: 100% 94% 98% 100%  Weight:      Height:        Intake/Output Summary (Last 24 hours) at 12/26/2018 1552 Last data filed at 12/26/2018 1500 Gross per 24 hour  Intake 1720.09 ml  Output 425 ml  Net 1295.09 ml   Filed Weights   12/22/18 0946  Weight: 72.6 kg    Examination:  General exam: Appears calm and comfortable  Respiratory system: Clear to auscultation. Respiratory effort normal. Cardiovascular system: S1 & S2 heard, RRR.  Gastrointestinal system: Abdomen is soft, tender, bowel sounds are good  Central nervous system: Alert and pleasantly confused, able to move all extremities, no focal deficits Extremities: Symmetric 5 x 5 power. Skin: No rashes, lesions or ulcers Psychiatry: Pleasantly confused    Data Reviewed: I have personally reviewed following labs and imaging studies  CBC: Recent Labs  Lab 12/20/18 1939  12/22/18 0118 12/23/18 8527 12/24/18 0326 12/25/18 0529 12/26/18 0330  WBC 13.1*   < > 9.9 12.6* 12.2* 7.6 7.3  NEUTROABS 10.6*  --   --   --   --   --   --   HGB 16.2*   < > 12.5 13.2 11.0* 11.4* 11.4*  HCT 47.7*   < >  38.6 40.8 33.8* 34.0* 35.1*  MCV 92.1   < > 92.1 92.3 93.4 92.6 92.4  PLT 310   < > 241 257 246 258 242   < > = values in this interval not displayed.   Basic Metabolic Panel: Recent Labs  Lab 12/22/18 0118 12/22/18 1632 12/23/18 0321 12/24/18 0326 12/25/18 0529 12/26/18 0330  NA 134* 138 135 138 136 137  K 3.1* 3.7 4.6 3.5 3.2* 4.1  CL 103 105 106 106 104 107  CO2 25 22 19* 23 26 22   GLUCOSE 108* 107* 137* 183* 160* 125*  BUN 52* 38* 46* 34* 21 16  CREATININE  1.09* 0.90 1.19* 0.78 0.63 0.69  CALCIUM 10.4* 10.5* 10.1 10.0 9.5 9.1  MG 1.8  --   --   --   --   --    GFR: Estimated Creatinine Clearance: 51.5 mL/min (by C-G formula based on SCr of 0.69 mg/dL). Liver Function Tests: Recent Labs  Lab 12/20/18 2039 12/24/18 0326 12/25/18 0529  AST 22 36 30  ALT 12 18 19   ALKPHOS 80 70 63  BILITOT 3.5* 2.6* 1.6*  PROT 6.9 5.0* 5.1*  ALBUMIN 3.1* 1.6* 1.6*   Recent Labs  Lab 12/20/18 2039  LIPASE 23   No results for input(s): AMMONIA in the last 168 hours. Coagulation Profile: No results for input(s): INR, PROTIME in the last 168 hours. Cardiac Enzymes: No results for input(s): CKTOTAL, CKMB, CKMBINDEX, TROPONINI in the last 168 hours. BNP (last 3 results) No results for input(s): PROBNP in the last 8760 hours. HbA1C: No results for input(s): HGBA1C in the last 72 hours. CBG: No results for input(s): GLUCAP in the last 168 hours. Lipid Profile: No results for input(s): CHOL, HDL, LDLCALC, TRIG, CHOLHDL, LDLDIRECT in the last 72 hours. Thyroid Function Tests: No results for input(s): TSH, T4TOTAL, FREET4, T3FREE, THYROIDAB in the last 72 hours. Anemia Panel: No results for input(s): VITAMINB12, FOLATE, FERRITIN, TIBC, IRON, RETICCTPCT in the last 72 hours. Sepsis Labs: Recent Labs  Lab 12/20/18 2039 12/20/18 2245  LATICACIDVEN 2.3* 2.1*    Recent Results (from the past 240 hour(s))  Blood culture (routine x 2)     Status: None   Collection Time: 12/20/18 10:36 PM  Result Value Ref Range Status   Specimen Description SITE NOT SPECIFIED  Final   Special Requests   Final    BOTTLES DRAWN AEROBIC AND ANAEROBIC Blood Culture results may not be optimal due to an inadequate volume of blood received in culture bottles   Culture   Final    NO GROWTH 5 DAYS Performed at [facility], [place] [address], [place], [place] [address]    Report Status 12/25/2018 FINAL  Final  Blood culture (routine x 2)     Status: None   Collection  Time: 12/20/18 10:38 PM  Result Value Ref Range Status   Specimen Description BLOOD LEFT ANTECUBITAL  Final   Special Requests   Final    BOTTLES DRAWN AEROBIC AND ANAEROBIC Blood Culture results may not be optimal due to an inadequate volume of blood received in culture bottles   Culture   Final    NO GROWTH 5 DAYS Performed at [facility], [place] [address], [place], [place] [address]    Report Status 12/25/2018 FINAL  Final  Surgical pcr screen     Status: Abnormal   Collection Time: 12/22/18  8:46 AM  Result Value Ref Range Status   MRSA, PCR NEGATIVE  NEGATIVE Final   Staphylococcus aureus POSITIVE (A) NEGATIVE Final    Comment: (NOTE) The Xpert SA Assay (FDA approved for NASAL specimens in patients 36 years of age and older), is one component of a comprehensive surveillance program. It is not intended to diagnose infection nor to guide or monitor treatment. Performed at [facility], [place] [address], [place], [place] [address]          Radiology Studies: No results found.      Scheduled Meds: . acetaminophen  1,000 mg Oral Q8H   Or  . acetaminophen  650 mg Rectal Q8H  . Chlorhexidine Gluconate Cloth  6 each Topical Daily  . enoxaparin (LOVENOX) injection  40 mg Subcutaneous Q24H  . feeding supplement  1 Container Oral TID BM  . feeding supplement (ENSURE ENLIVE)  237 mL Oral BID BM  . fluticasone  1 spray Each Nare Daily  . mupirocin ointment  1 application Nasal BID   Continuous Infusions: . dextrose 5 % and 0.9 % NaCl with KCl 40 mEq/L 75 mL/hr at 12/26/18 1500  . piperacillin-tazobactam (ZOSYN)  IV 3.375 g (12/26/18 1256)     LOS: 5 days    Time spent: 35 minutes    Hosie Poisson, MD [facility] Pager [phone]  If 7PM-7AM, please contact night-coverage [email or web address] Password [facility] 12/26/2018, 3:52 PM

## 2018-12-26 NOTE — Evaluation (Signed)
Occupational Therapy Evaluation Patient Details Name: Felicia Stephens MRN: [id number] DOB: 1933/07/20 Today's Date: 12/26/2018    History of Present Illness 85 y/o female who presents with abdominal pain, and N/V, found to have Incarcerated ventral hernia s/p primary hernia repair with small bowel repair 4/17. PMH includes HTN, HLD.   Clinical Impression   Patient is s/p hernia repair with small bowel repair surgery resulting in functional limitations due to the deficits listed below (see OT problem list). Pt currently total +2 mod (A) for lines transfer and mod (A) for eating task. Pt with cognitive deficits noted. Recommend SLP for cognition follow up. Uncertain of baseline cognition vs delirium at this time.  Patient will benefit from skilled OT acutely to increase independence and safety with ADLS to allow discharge SNF.     Follow Up Recommendations  SNF    Equipment Recommendations  3 in 1 bedside commode;Other (comment);Hospital bed(RW)    Recommendations for Other Services  SLP      Precautions / Restrictions Precautions Precautions: Fall Precaution Comments: Jp drain      Mobility Bed Mobility Overal bed mobility: Needs Assistance Bed Mobility: Sit to Supine       Sit to supine: Max assist   General bed mobility comments: pt requires positioning of BIL UE and blocking of L elbow to lay down on Right side . pt total (A) for BIL LE onto bed surface. pt once supine able to bridge with bil LE   Transfers Overall transfer level: Needs assistance Equipment used: Rolling walker (2 wheeled) Transfers: Sit to/from Stand Sit to Stand: +2 physical assistance;Mod assist         General transfer comment: pt requires counting to help sequence task and power up from chair     Balance Overall balance assessment: Needs assistance Sitting-balance support: Feet supported;Bilateral upper extremity supported Sitting balance-Leahy Scale: Fair       Standing balance-Leahy  Scale: Poor Standing balance comment: reliance on RW                           ADL either performed or assessed with clinical judgement   ADL Overall ADL's : Needs assistance/impaired Eating/Feeding: Moderate assistance;Sitting Eating/Feeding Details (indicate cue type and reason): hand over hand with central vision and min direct cues. pt with support at elbow and hand. pt drining 1/2 ensure at this time. pt reports "that is good"      Upper Body Bathing: Maximal assistance   Lower Body Bathing: Total assistance   Upper Body Dressing : Maximal assistance   Lower Body Dressing: Total assistance   Toilet Transfer: +2 for physical assistance;Moderate assistance;BSC;RW Toilet Transfer Details (indicate cue type and reason): pt requires incr time and singing gospel to pee. pt with odor to urine Toileting- Clothing Manipulation and Hygiene: Total assistance Toileting - Clothing Manipulation Details (indicate cue type and reason): pt requires total (A) for peri in standing     Functional mobility during ADLs: +2 for physical assistance;Moderate assistance;Rolling walker(line management) General ADL Comments: pt moving to her favorite song and needs (A) to move RW. pt mod (A) for RW use and second person for IV pole and lines.      Vision Baseline Vision/History: Wears glasses Wears Glasses: Reading only       Perception     Praxis      Pertinent Vitals/Pain Pain Assessment: No/denies pain     Hand Dominance Right   Extremity/Trunk Assessment  Upper Extremity Assessment Upper Extremity Assessment: Generalized weakness       Cervical / Trunk Assessment Cervical / Trunk Assessment: Kyphotic   Communication Communication Communication: HOH   Cognition Arousal/Alertness: Awake/alert Behavior During Therapy: Flat affect;Restless Overall Cognitive Status: Impaired/Different from baseline Area of Impairment: Orientation;Attention;Memory;Following  commands;Safety/judgement;Problem solving;Awareness                 Orientation Level: Disoriented to;Place;Time;Situation;Person Current Attention Level: Focused Memory: Decreased recall of precautions;Decreased short-term memory Following Commands: Follows one step commands inconsistently;Follows one step commands with increased time Safety/Judgement: Decreased awareness of safety;Decreased awareness of deficits Awareness: Intellectual Problem Solving: Slow processing;Decreased initiation;Difficulty sequencing;Requires verbal cues;Requires tactile cues General Comments: pt fixated on satan being able to hear her name. pt refusing to state name with even automatic responses. pt reports that at teh end of [place] [address] that satan tries to hide in the truth. pt reports therapist as Stanton Kidney and states "i knew there was good in you" pt educated x3 during session of OT name. pt with no recall of information. pt demonstrates [facility] of tv program and benefits from quiet environment. pt reports name as "hygiene " when asked   General Comments       Exercises     Shoulder Instructions      Home Living Family/patient expects to be discharged to:: Skilled nursing facility                                 Additional Comments: pt unreliable source and poor cognition at this time      Prior Functioning/Environment                   OT Problem List: Decreased strength;Decreased activity tolerance;Decreased safety awareness;Decreased cognition;Decreased coordination;Impaired balance (sitting and/or standing);Impaired vision/perception;Decreased knowledge of use of DME or AE;Decreased knowledge of precautions;Cardiopulmonary status limiting activity;Obesity      OT Treatment/Interventions: Self-care/ADL training;Therapeutic exercise;Neuromuscular education;Energy conservation;DME and/or AE instruction;Manual therapy;Modalities;Therapeutic activities;Cognitive  remediation/compensation;Patient/family education;Balance training    OT Goals(Current goals can be found in the care plan section) Acute Rehab OT Goals Patient Stated Goal: to not let satan hear OT Goal Formulation: Patient unable to participate in goal setting Time For Goal Achievement: 01/09/19 Potential to Achieve Goals: Good  OT Frequency: Min 2X/week   Barriers to D/C: Other (comment)(unknown- son and spouse listed in chart)          Co-evaluation              AM-PAC OT "6 Clicks" Daily Activity     Outcome Measure Help from another person eating meals?: A Lot Help from another person taking care of personal grooming?: A Lot Help from another person toileting, which includes using toliet, bedpan, or urinal?: A Lot Help from another person bathing (including washing, rinsing, drying)?: A Lot Help from another person to put on and taking off regular upper body clothing?: A Lot Help from another person to put on and taking off regular lower body clothing?: Total 6 Click Score: 11   End of Session Equipment Utilized During Treatment: Rolling walker Nurse Communication: Mobility status;Precautions  Activity Tolerance: Patient tolerated treatment well Patient left: in bed;with call bell/phone within reach;with bed alarm set;with nursing/sitter in room  OT Visit Diagnosis: Unsteadiness on feet (R26.81);Muscle weakness (generalized) (M62.81)                Time: 1400-1450 OT Time Calculation (min): 50  min Charges:  OT General Charges $OT Visit: 1 Visit OT Evaluation $OT Eval Moderate Complexity: 1 Mod OT Treatments $Self Care/Home Management : 23-37 mins   Jeri Modena, OTR/L  Acute Rehabilitation Services Pager: [phone] Office: [phone] .   Jeri Modena 12/26/2018, 3:20 PM

## 2018-12-26 NOTE — Progress Notes (Signed)
[facility] Progress Note  4 Days Post-Op  Subjective: CC: delirium Patient somewhat oriented this AM - able to tell me who she is and that she is in a hospital and that it is April 2020. Not able to tell me why she is here. Denies abdominal pain currently. Denies nausea. Unsure of whether or not she has passed flatus. Worked well with PT yesterday. Dressing was changed 0500 this AM.   Objective: Vital signs in last 24 hours: Temp:  [97.6 F (36.4 C)-98.5 F (36.9 C)] 98.2 F (36.8 C) (04/21 0445) Pulse Rate:  [77-83] 77 (04/21 0445) Resp:  [16-18] 16 (04/21 0445) BP: (153-168)/(67-73) 155/73 (04/21 0445) SpO2:  [94 %-100 %] 98 % (04/21 0445) Last BM Date: 12/21/18  Intake/Output from previous day: 04/20 0701 - 04/21 0700 In: 470 [P.O.:120] Out: 250 [Urine:250] Intake/Output this shift: No intake/output data recorded.  PE: Gen:  Alert, NAD, pleasant Card:  Regular rate and rhythm Pulm:  Normal effort, clear to auscultation bilaterally Abd: Soft, appropriately tender, non-distended, bowel sounds hypoactive, midline wound clean, drain with SS fluid Skin: warm and dry, no rashes   Lab Results:  Recent Labs    12/25/18 0529 12/26/18 0330  WBC 7.6 7.3  HGB 11.4* 11.4*  HCT 34.0* 35.1*  PLT 258 242   BMET Recent Labs    12/25/18 0529 12/26/18 0330  NA 136 137  K 3.2* 4.1  CL 104 107  CO2 26 22  GLUCOSE 160* 125*  BUN 21 16  CREATININE 0.63 0.69  CALCIUM 9.5 9.1   PT/INR No results for input(s): LABPROT, INR in the last 72 hours. CMP     Component Value Date/Time   NA 137 12/26/2018 0330   K 4.1 12/26/2018 0330   CL 107 12/26/2018 0330   CO2 22 12/26/2018 0330   GLUCOSE 125 (H) 12/26/2018 0330   BUN 16 12/26/2018 0330   CREATININE 0.69 12/26/2018 0330   CALCIUM 9.1 12/26/2018 0330   PROT 5.1 (L) 12/25/2018 0529   ALBUMIN 1.6 (L) 12/25/2018 0529   AST 30 12/25/2018 0529   ALT 19 12/25/2018 0529   ALKPHOS 63 12/25/2018 0529   BILITOT 1.6  (H) 12/25/2018 0529   GFRNONAA >60 12/26/2018 0330   GFRAA >60 12/26/2018 0330   Lipase     Component Value Date/Time   LIPASE 23 12/20/2018 2039       Studies/Results: No results found.  Anti-infectives: Anti-infectives (From admission, onward)   Start     Dose/Rate Route Frequency Ordered Stop   12/22/18 1400  piperacillin-tazobactam (ZOSYN) IVPB 3.375 g     3.375 g 12.5 mL/hr over 240 Minutes Intravenous Every 8 hours 12/22/18 1332     12/22/18 1100  cefoTEtan in Dextrose 5% (CEFOTAN) IVPB 2 g  Status:  Discontinued     2 g 100 mL/hr over 30 Minutes Intravenous Every 12 hours 12/22/18 1047 12/22/18 1315   12/20/18 2130  piperacillin-tazobactam (ZOSYN) IVPB 3.375 g     3.375 g 100 mL/hr over 30 Minutes Intravenous  Once 12/20/18 2124 12/20/18 2329       Assessment/Plan HTN HLD ABL amemia - H/H 11.4/35, stable Acute metabolic encephalopathy AKI - Cr 0.69, stable Hypokalemia - K 4.1, improved Hyperbilirubinemia - improving, Tbili 1.6 yesterday from 2.6  Incarcerated ventral hernia - s/p primary hernia repair with SBRx1 Dr. Rosendo Gros 12/22/18 - POD#4 - continue BID dressing changes - tolerated NGT clamping - remove NGT and allow sips of clears - await return  in bowel function  - continue PT/OT  FEN:  IVF, sips VTE: SCDs, lovenox ID: Zosyn 4/15>>  LOS: 5 days    Brigid Re , [facility] 12/26/2018, 7:39 AM Pager: [phone] Consults: [phone]

## 2018-12-27 LAB — BASIC METABOLIC PANEL
Anion gap: 10 (ref 5–15)
BUN: 13 mg/dL (ref 8–23)
CO2: 22 mmol/L (ref 22–32)
Calcium: 9.4 mg/dL (ref 8.9–10.3)
Chloride: 107 mmol/L (ref 98–111)
Creatinine, Ser: 0.69 mg/dL (ref 0.44–1.00)
GFR calc Af Amer: >60 mL/min (ref >60)
GFR calc non Af Amer: >60 mL/min (ref >60)
Glucose, Bld: 136 mg/dL — ABNORMAL HIGH (ref 70–99)
Potassium: 4 mmol/L (ref 3.5–5.1)
Sodium: 139 mmol/L (ref 135–145)

## 2018-12-27 MED ORDER — HYDROCHLOROTHIAZIDE 25 MG PO TABS
12.5000 mg | ORAL_TABLET | Freq: Every day | ORAL | Status: DC
  Administered 2018-12-27 – 2018-12-29 (×3): 12.5 mg via ORAL
  Filled 2018-12-27 (×3): qty 1

## 2018-12-27 MED ORDER — ACETAMINOPHEN 650 MG RE SUPP: 650.0000 mg | Freq: Three times a day (TID) | RECTAL | Status: DC

## 2018-12-27 MED ORDER — LISINOPRIL 5 MG PO TABS
5.0000 mg | ORAL_TABLET | Freq: Every day | ORAL | Status: DC
  Administered 2018-12-27 – 2018-12-29 (×3): 5 mg via ORAL
  Filled 2018-12-27 (×3): qty 1

## 2018-12-27 MED ORDER — ACETAMINOPHEN 500 MG PO TABS: 500.0000 mg | ORAL_TABLET | Freq: Three times a day (TID) | ORAL | Status: DC

## 2018-12-27 MED ORDER — ACETAMINOPHEN 325 MG PO TABS
650.0000 mg | ORAL_TABLET | Freq: Three times a day (TID) | ORAL | Status: DC
  Filled 2018-12-27 (×3): qty 2

## 2018-12-27 NOTE — Progress Notes (Signed)
PROGRESS NOTE    Felicia Stephens  ZOX:[id number] DOB: 18-Nov-1932 DOA: 12/20/2018 PCP: Lajean Manes, MD  Brief Narrative:  85 year old female with PMH of HLD, HTN, large ventral incisional hernia presented to [facility] ED on 4/15 due to 3 days history of acute onset of abdominal pain, nausea and vomiting.  She was initially admitted for SBO due to large hernia.  Failed conservative management including bowel rest and NG tube.  On 4/17, general surgery performed emergent exploratory laparotomy, small bowel resection and hernia repair for incarcerated ventral hernia with ischemic bowel.  Ongoing postop ileus.  Mobilizing.  Awaiting return of bowel function to advance diet.   Assessment & Plan:   Principal Problem:   Ventral hernia with bowel obstruction Active Problems:   HTN (hypertension)  Better ventral hernia with ischemic bowel Failed out conservative measures Patient underwent primary hernia repair with small bowel resection on 4/17. Surgery on board and appreciate their assistance. Started on clears, and continue IV fluids. Continue with IV zosyn.    Acute metabolic encephalopathy Multifactorial due to acute medical illness including AKI, sleep deprivation postop delirium, hospital delirium, pain and opioid use.  As per the family patient does not have a history of dementia. MRI brain, does not show any acute stroke.     Hypertension Sub optimal today.    Dehydration and hyponatremia Have improved    Acute blood loss early anemia Secondary to postop blood loss. Infuse to keep hemoglobin greater than 7.  AKI Resolved with hydration.   Hyperlipidemia Restart statins in am.    DVT prophylaxis: lovenox Code Status: Full code Family Communication: None at bedside. Spoke to daughter over the phone.  Disposition Plan: Pending return of bowel function and resolution of acute encephalopathy   Consultants:   Surgery  Gastroenterology   Procedures: Hernia repair  with small bowel section  Antimicrobials: Zosyn  Subjective: Confused but pleasant.   Objective: Vitals:   12/26/18 1457 12/26/18 2149 12/27/18 0556 12/27/18 1133  BP: (!) 146/61 (!) 154/80 (!) 134/102 (!) 170/81  Pulse: 91 90 92   Resp: 20 17 16    Temp: 97.8 F (36.6 C) 98.7 F (37.1 C)    TempSrc: Oral     SpO2: 100% 98% 94%   Weight:      Height:        Intake/Output Summary (Last 24 hours) at 12/27/2018 1400 Last data filed at 12/27/2018 0758 Gross per 24 hour  Intake 2872.07 ml  Output 130 ml  Net 2742.07 ml   Filed Weights   12/22/18 0946  Weight: 72.6 kg    Examination:  General exam: Appears calm and comfortable  Respiratory system: Clear to auscultation. Respiratory effort normal. Cardiovascular system: S1 & S2 heard, RRR.  Gastrointestinal system: Abdomen is soft, tender, bowel sounds are good  Central nervous system: Alert and pleasantly confused, able to move all extremities, no focal deficits Extremities: Symmetric 5 x 5 power. Skin: No rashes, lesions or ulcers Psychiatry: Pleasantly confused    Data Reviewed: I have personally reviewed following labs and imaging studies  CBC: Recent Labs  Lab 12/20/18 1939  12/22/18 0118 12/23/18 8119 12/24/18 0326 12/25/18 0529 12/26/18 0330  WBC 13.1*   < > 9.9 12.6* 12.2* 7.6 7.3  NEUTROABS 10.6*  --   --   --   --   --   --   HGB 16.2*   < > 12.5 13.2 11.0* 11.4* 11.4*  HCT 47.7*   < > 38.6 40.8 33.8*  34.0* 35.1*  MCV 92.1   < > 92.1 92.3 93.4 92.6 92.4  PLT 310   < > 241 257 246 258 242   < > = values in this interval not displayed.   Basic Metabolic Panel: Recent Labs  Lab 12/22/18 0118  12/23/18 0321 12/24/18 0326 12/25/18 0529 12/26/18 0330 12/27/18 0351  NA 134*   < > 135 138 136 137 139  K 3.1*   < > 4.6 3.5 3.2* 4.1 4.0  CL 103   < > 106 106 104 107 107  CO2 25   < > 19* 23 26 22 22   GLUCOSE 108*   < > 137* 183* 160* 125* 136*  BUN 52*   < > 46* 34* 21 16 13   CREATININE 1.09*   <  > 1.19* 0.78 0.63 0.69 0.69  CALCIUM 10.4*   < > 10.1 10.0 9.5 9.1 9.4  MG 1.8  --   --   --   --   --   --    < > = values in this interval not displayed.   GFR: Estimated Creatinine Clearance: 51.5 mL/min (by C-G formula based on SCr of 0.69 mg/dL). Liver Function Tests: Recent Labs  Lab 12/20/18 2039 12/24/18 0326 12/25/18 0529  AST 22 36 30  ALT 12 18 19   ALKPHOS 80 70 63  BILITOT 3.5* 2.6* 1.6*  PROT 6.9 5.0* 5.1*  ALBUMIN 3.1* 1.6* 1.6*   Recent Labs  Lab 12/20/18 2039  LIPASE 23   No results for input(s): AMMONIA in the last 168 hours. Coagulation Profile: No results for input(s): INR, PROTIME in the last 168 hours. Cardiac Enzymes: No results for input(s): CKTOTAL, CKMB, CKMBINDEX, TROPONINI in the last 168 hours. BNP (last 3 results) No results for input(s): PROBNP in the last 8760 hours. HbA1C: No results for input(s): HGBA1C in the last 72 hours. CBG: No results for input(s): GLUCAP in the last 168 hours. Lipid Profile: No results for input(s): CHOL, HDL, LDLCALC, TRIG, CHOLHDL, LDLDIRECT in the last 72 hours. Thyroid Function Tests: No results for input(s): TSH, T4TOTAL, FREET4, T3FREE, THYROIDAB in the last 72 hours. Anemia Panel: No results for input(s): VITAMINB12, FOLATE, FERRITIN, TIBC, IRON, RETICCTPCT in the last 72 hours. Sepsis Labs: Recent Labs  Lab 12/20/18 2039 12/20/18 2245  LATICACIDVEN 2.3* 2.1*    Recent Results (from the past 240 hour(s))  Blood culture (routine x 2)     Status: None   Collection Time: 12/20/18 10:36 PM  Result Value Ref Range Status   Specimen Description SITE NOT SPECIFIED  Final   Special Requests   Final    BOTTLES DRAWN AEROBIC AND ANAEROBIC Blood Culture results may not be optimal due to an inadequate volume of blood received in culture bottles   Culture   Final    NO GROWTH 5 DAYS Performed at [facility], [place] [address], [place], [place] [address]    Report Status 12/25/2018 FINAL  Final  Blood  culture (routine x 2)     Status: None   Collection Time: 12/20/18 10:38 PM  Result Value Ref Range Status   Specimen Description BLOOD LEFT ANTECUBITAL  Final   Special Requests   Final    BOTTLES DRAWN AEROBIC AND ANAEROBIC Blood Culture results may not be optimal due to an inadequate volume of blood received in culture bottles   Culture   Final    NO GROWTH 5 DAYS Performed at [facility], [address] [place], [place] [address]    Report Status 12/25/2018 FINAL  Final  Surgical pcr screen     Status: Abnormal   Collection Time: 12/22/18  8:46 AM  Result Value Ref Range Status   MRSA, PCR NEGATIVE NEGATIVE Final   Staphylococcus aureus POSITIVE (A) NEGATIVE Final    Comment: (NOTE) The Xpert SA Assay (FDA approved for NASAL specimens in patients 50 years of age and older), is one component of a comprehensive surveillance program. It is not intended to diagnose infection nor to guide or monitor treatment. Performed at [facility], [place] [address] [place], [place] [address]          Radiology Studies: Mr Brain Wo Contrast  Result Date: 12/26/2018 CLINICAL DATA:  Status post ventral hernia repair, postoperative confusion. EXAM: MRI HEAD WITHOUT CONTRAST TECHNIQUE: Multiplanar, multiecho pulse sequences of the brain and surrounding structures were obtained without intravenous contrast. COMPARISON:  None. FINDINGS: Brain: No acute infarction, hemorrhage, hydrocephalus, extra-axial collection or mass lesion. Moderate cerebral and cerebellar atrophy, not unexpected for age. Moderately advanced subcortical and periventricular T2 and FLAIR hyperintensities, likely chronic microvascular ischemic change. Vascular: Normal flow voids. Skull and upper cervical spine: Normal marrow signal. Sinuses/Orbits: Negative. Other: None. IMPRESSION: Moderate to advanced atrophy and small vessel disease. No acute intracranial findings. No evidence for occult postoperative infarction or  intracranial hemorrhage. Electronically Signed   By: Staci Righter M.D.   On: 12/26/2018 18:25        Scheduled Meds: . acetaminophen  650 mg Oral Q8H   Or  . acetaminophen  650 mg Rectal Q8H  . Chlorhexidine Gluconate Cloth  6 each Topical Daily  . enoxaparin (LOVENOX) injection  40 mg Subcutaneous Q24H  . feeding supplement (ENSURE ENLIVE)  237 mL Oral BID BM  . fluticasone  1 spray Each Nare Daily  . hydrochlorothiazide  12.5 mg Oral Daily  . lisinopril  5 mg Oral Daily  . mupirocin ointment  1 application Nasal BID   Continuous Infusions: . dextrose 5 % and 0.9 % NaCl with KCl 40 mEq/L 75 mL/hr at 12/27/18 0651  . piperacillin-tazobactam (ZOSYN)  IV 3.375 g (12/27/18 1338)     LOS: 6 days    Time spent: 35 minutes    Hosie Poisson, MD [facility] Pager [phone]  If 7PM-7AM, please contact night-coverage [email or web address] Password [email or web address] 12/27/2018, 2:00 PM

## 2018-12-27 NOTE — Plan of Care (Addendum)
Patient sometimes exhibited post delirium and paranoia (called this RN and NTs bad/evil) during the incontinent care but this RN reoriented patient that she needed to be changed and dry to be more comfortable.  Patient finally allowed the NTs to changed her including linen change.  This RN gave warm blanket to patient and patient trying to get some sleep. Problem: Education: Goal: Knowledge of General Education information will improve Description Including pain rating scale, medication(s)/side effects and non-pharmacologic comfort measures Outcome: Progressing    Problem: Clinical Measurements: Goal: Ability to maintain clinical measurements within normal limits will improve Outcome: Progressing Goal: Will remain free from infection Outcome: Progressing Goal: Diagnostic test results will improve Outcome: Progressing Goal: Respiratory complications will improve 12/27/2018 0425 by Donzetta Matters, RN Outcome: Progressing 12/27/2018 0419 by Donzetta Matters, RN Outcome: Progressing Goal: Cardiovascular complication will be avoided 12/27/2018 0425 by Donzetta Matters, RN Outcome: Progressing 12/27/2018 0419 by Donzetta Matters, RN Outcome: Progressing   Problem: Coping: Goal: Level of anxiety will decrease 12/27/2018 0425 by Donzetta Matters, RN Outcome: Progressing 12/27/2018 0419 by Donzetta Matters, RN Outcome: Progressing   Problem: Elimination: Goal: Will not experience complications related to urinary retention 12/27/2018 0425 by Donzetta Matters, RN Outcome: Progressing 12/27/2018 0419 by Donzetta Matters, RN Outcome: Progressing   Problem: Pain Managment: Goal: General experience of comfort will improve Outcome: Progressing   Problem: Safety: Goal: Ability to remain free from injury will improve 12/27/2018 0425 by Donzetta Matters, RN Outcome: Progressing 12/27/2018 0419 by Donzetta Matters, RN Outcome: Progressing   Problem: Skin Integrity: Goal: Risk for impaired skin integrity will  decrease 12/27/2018 0425 by Donzetta Matters, RN Outcome: Progressing 12/27/2018 0419 by Donzetta Matters, RN Outcome: Progressing   Problem: Education: Goal: Knowledge of General Education information will improve Description Including pain rating scale, medication(s)/side effects and non-pharmacologic comfort measures Outcome: Progressing    Problem: Clinical Measurements: Goal: Ability to maintain clinical measurements within normal limits will improve Outcome: Progressing Goal: Will remain free from infection Outcome: Progressing Goal: Diagnostic test results will improve Outcome: Progressing Goal: Respiratory complications will improve 12/27/2018 0425 by Donzetta Matters, RN Outcome: Progressing 12/27/2018 0419 by Donzetta Matters, RN Outcome: Progressing Goal: Cardiovascular complication will be avoided 12/27/2018 0425 by Donzetta Matters, RN Outcome: Progressing 12/27/2018 0419 by Donzetta Matters, RN Outcome: Progressing   Problem: Coping: Goal: Level of anxiety will decrease Outcome: Progressing   Problem: Elimination: Goal: Will not experience complications related to urinary retention Outcome: Progressing   Problem: Pain Managment: Goal: General experience of comfort will improve Outcome: Progressing   Problem: Safety: Goal: Ability to remain free from injury will improve Outcome: Progressing   Problem: Skin Integrity: Goal: Risk for impaired skin integrity will decrease Outcome: Progressing

## 2018-12-27 NOTE — Progress Notes (Signed)
[facility] Progress Note  5 Days Post-Op  Subjective: CC: wants to go home Patient is more oriented this AM than yesterday, but still seems somewhat confused at times. Patient denies abdominal pain this AM. Denied nausea. Unsure of flatus or BM. No BM recorded and RN did not report any BM.   Objective: Vital signs in last 24 hours: Temp:  [97.8 F (36.6 C)-98.7 F (37.1 C)] 98.7 F (37.1 C) (04/21 2149) Pulse Rate:  [90-92] 92 (04/22 0556) Resp:  [16-20] 16 (04/22 0556) BP: (134-154)/(61-102) 134/102 (04/22 0556) SpO2:  [94 %-100 %] 94 % (04/22 0556) Last BM Date: 12/21/18  Intake/Output from previous day: 04/21 0701 - 04/22 0700 In: 2932.1 [P.O.:60; I.V.:2395.7; IV Piggyback:476.4] Out: 280 [Urine:275; Drains:5] Intake/Output this shift: No intake/output data recorded.  PE: Gen: Alert, NAD, pleasant Card: Regular rate and rhythm Pulm: Normal effort, clear to auscultation bilaterally Abd: Soft,appropriatelytender, non-distended, +BS, midline wound clean, drain with SS fluid Skin: warm and dry, no rashes   Lab Results:  Recent Labs    12/25/18 0529 12/26/18 0330  WBC 7.6 7.3  HGB 11.4* 11.4*  HCT 34.0* 35.1*  PLT 258 242   BMET Recent Labs    12/26/18 0330 12/27/18 0351  NA 137 139  K 4.1 4.0  CL 107 107  CO2 22 22  GLUCOSE 125* 136*  BUN 16 13  CREATININE 0.69 0.69  CALCIUM 9.1 9.4   PT/INR No results for input(s): LABPROT, INR in the last 72 hours. CMP     Component Value Date/Time   NA 139 12/27/2018 0351   K 4.0 12/27/2018 0351   CL 107 12/27/2018 0351   CO2 22 12/27/2018 0351   GLUCOSE 136 (H) 12/27/2018 0351   BUN 13 12/27/2018 0351   CREATININE 0.69 12/27/2018 0351   CALCIUM 9.4 12/27/2018 0351   PROT 5.1 (L) 12/25/2018 0529   ALBUMIN 1.6 (L) 12/25/2018 0529   AST 30 12/25/2018 0529   ALT 19 12/25/2018 0529   ALKPHOS 63 12/25/2018 0529   BILITOT 1.6 (H) 12/25/2018 0529   GFRNONAA >60 12/27/2018 0351   GFRAA >60  12/27/2018 0351   Lipase     Component Value Date/Time   LIPASE 23 12/20/2018 2039       Studies/Results: Mr Brain Wo Contrast  Result Date: 12/26/2018 CLINICAL DATA:  Status post ventral hernia repair, postoperative confusion. EXAM: MRI HEAD WITHOUT CONTRAST TECHNIQUE: Multiplanar, multiecho pulse sequences of the brain and surrounding structures were obtained without intravenous contrast. COMPARISON:  None. FINDINGS: Brain: No acute infarction, hemorrhage, hydrocephalus, extra-axial collection or mass lesion. Moderate cerebral and cerebellar atrophy, not unexpected for age. Moderately advanced subcortical and periventricular T2 and FLAIR hyperintensities, likely chronic microvascular ischemic change. Vascular: Normal flow voids. Skull and upper cervical spine: Normal marrow signal. Sinuses/Orbits: Negative. Other: None. IMPRESSION: Moderate to advanced atrophy and small vessel disease. No acute intracranial findings. No evidence for occult postoperative infarction or intracranial hemorrhage. Electronically Signed   By: Staci Righter M.D.   On: 12/26/2018 18:25    Anti-infectives: Anti-infectives (From admission, onward)   Start     Dose/Rate Route Frequency Ordered Stop   12/22/18 1400  piperacillin-tazobactam (ZOSYN) IVPB 3.375 g     3.375 g 12.5 mL/hr over 240 Minutes Intravenous Every 8 hours 12/22/18 1332     12/22/18 1100  cefoTEtan in Dextrose 5% (CEFOTAN) IVPB 2 g  Status:  Discontinued     2 g 100 mL/hr over 30 Minutes Intravenous Every 12  hours 12/22/18 1047 12/22/18 1315   12/20/18 2130  piperacillin-tazobactam (ZOSYN) IVPB 3.375 g     3.375 g 100 mL/hr over 30 Minutes Intravenous  Once 12/20/18 2124 12/20/18 2329       Assessment/Plan HTN HLD ABL amemia- H/H 11.4/35, stable Acute metabolic encephalopathy AKI- Cr 0.69, stable Hypokalemia - K 4.0, improved  Incarcerated ventral hernia - s/p primary hernia repair with SBRx1 Dr. Rosendo Gros 12/22/18 - POD#5 -  continue BID dressing changes - start CLD, await return in bowel function  - continue PT/OT - drain with 5 cc SS output, will remove prior to d/c   FEN: CLD, IVF VTE: SCDs, lovenox ID: [id number] 4/15>>  LOS: 6 days    Brigid Re , [facility] 12/27/2018, 7:13 AM Pager: [phone] Consults: [phone]

## 2018-12-27 NOTE — Progress Notes (Addendum)
Physical Therapy Treatment Patient Details Name: Felicia Stephens MRN: [id number] DOB: 02/22/1933 Today's Date: 12/27/2018    History of Present Illness (P) 85 y/o female who presents with abdominal pain, and N/V, found to have Incarcerated ventral hernia s/p primary hernia repair with small bowel repair 4/17. PMH includes HTN, HLD.    PT Comments    Pt supine in bed on arrival.  Upon sitting edge of bed presents with bout of stool incontinence. Co tx performed with OT and patient able to gait train to and from bathroom and performed ADLS at sink with min assist for dynamic standing tasks.  Plan for progression of functional mobility next session. Plan for return home with support from family remains appropriate.      Follow Up Recommendations  Home health PT;Supervision for mobility/OOB     Equipment Recommendations  Rolling walker with 5" wheels(if she does not have one)    Recommendations for Other Services       Precautions / Restrictions Precautions Precautions: (P) Fall Precaution Comments: (P) Jp drain Restrictions Weight Bearing Restrictions: No    Mobility  Bed Mobility Overal bed mobility: Needs Assistance Bed Mobility: Sit to Supine Rolling: Min assist Sidelying to sit: Min assist;+2 for physical assistance       General bed mobility comments: Pt able to advance LEs to edge of bed min +2 to elevate trunk into sitting.    Transfers Overall transfer level: Needs assistance Equipment used: Rolling walker (2 wheeled) Transfers: Sit to/from Stand Sit to Stand: Min assist;+2 physical assistance         General transfer comment: Cues for hand placement to push from bed, patient able to recall sequencing to return to seated surface with good eccentric loading.    Ambulation/Gait Ambulation/Gait assistance: Min assist;+2 safety/equipment Gait Distance (Feet): 20 Feet Assistive device: Rolling walker (2 wheeled) Gait Pattern/deviations: Step-through pattern;Trunk  flexed;Decreased stride length Gait velocity: decreased   General Gait Details: Cues for upper trunk control and forward gaze.  Assistance to negotiate obstacles and door frame.  Pt is slow and cautious with movements.    Stairs             Wheelchair Mobility    Modified Rankin (Stroke Patients Only)       Balance Overall balance assessment: Needs assistance   Sitting balance-Leahy Scale: Fair       Standing balance-Leahy Scale: Poor                              Cognition Arousal/Alertness: Awake/alert Behavior During Therapy: Flat affect;Restless Overall Cognitive Status: Impaired/Different from baseline Area of Impairment: Orientation;Attention;Memory;Following commands;Safety/judgement;Problem solving;Awareness                 Orientation Level: Disoriented to;Place;Time;Situation Current Attention Level: Focused Memory: Decreased recall of precautions;Decreased short-term memory Following Commands: Follows one step commands inconsistently;Follows one step commands with increased time Safety/Judgement: Decreased awareness of safety;Decreased awareness of deficits Awareness: Intellectual Problem Solving: Decreased initiation;Difficulty sequencing;Requires verbal cues;Requires tactile cues;Slow processing General Comments: Pt more appropriate during session but easily distracted with perseveration.        Exercises General Exercises - Lower Extremity Ankle Circles/Pumps: AROM;Both;10 reps;Supine Heel Slides: AAROM;Both;10 reps;Supine    General Comments        Pertinent Vitals/Pain Pain Assessment: (P) Faces Faces Pain Scale: Hurts little more Pain Location: abdomen during mobility Pain Descriptors / Indicators: Sore;Grimacing;Guarding Pain Intervention(s): Monitored during session;Repositioned;Ice applied  Home Living                      Prior Function            PT Goals (current goals can now be found in the  care plan section) Acute Rehab PT Goals Patient Stated Goal: To see her daughter Potential to Achieve Goals: Fair Progress towards PT goals: Progressing toward goals    Frequency    Min 3X/week      PT Plan Current plan remains appropriate    Co-evaluation PT/OT/SLP Co-Evaluation/Treatment: Yes Reason for Co-Treatment: Complexity of the patient's impairments (multi-system involvement);Necessary to address cognition/behavior during functional activity PT goals addressed during session: Mobility/safety with mobility OT goals addressed during session: ADL's and self-care      AM-PAC PT "6 Clicks" Mobility   Outcome Measure  Help needed turning from your back to your side while in a flat bed without using bedrails?: A Little Help needed moving from lying on your back to sitting on the side of a flat bed without using bedrails?: A Little Help needed moving to and from a bed to a chair (including a wheelchair)?: A Little Help needed standing up from a chair using your arms (e.g., wheelchair or bedside chair)?: A Little Help needed to walk in hospital room?: A Little Help needed climbing 3-5 steps with a railing? : A Lot 6 Click Score: 17    End of Session   Activity Tolerance: Patient tolerated treatment well Patient left: in chair;with call bell/phone within reach;with chair alarm set Nurse Communication: Mobility status PT Visit Diagnosis: Pain;Difficulty in walking, not elsewhere classified (R26.2);Muscle weakness (generalized) (M62.81);Unsteadiness on feet (R26.81) Pain - part of body: (abdomen)     Time: 3235-5732 PT Time Calculation (min) (ACUTE ONLY): 30 min  Charges:  $Gait Training: 8-22 mins                     Governor Rooks, PTA Acute Rehabilitation Services Pager [phone] Office [phone]     Felicia Stephens 12/27/2018, 4:30 PM

## 2018-12-27 NOTE — Plan of Care (Deleted)
Patient still exhibited intermittent postop delirium but can be reoriented.

## 2018-12-27 NOTE — Progress Notes (Signed)
Patient refused scheduled tylenol, denies any pain and refused dressing change on abdominal surgery despite daughter talking to patient over the phone, still has intermittent confusion.  Will monitor.

## 2018-12-27 NOTE — Progress Notes (Signed)
Occupational Therapy Treatment Patient Details Name: Felicia Stephens MRN: [id number] DOB: 1933/02/20 Today's Date: 12/27/2018    History of present illness 85 y/o female who presents with abdominal pain, and N/V, found to have Incarcerated ventral hernia s/p primary hernia repair with small bowel repair 4/17. PMH includes HTN, HLD.   OT comments  Pt presenting with some cognitive improvements and more appropriate behaviors compared to previous session but continues to present with impaired cognition.  Upon standing from bed, pt presented with stool incontinence in bed. She required total assist for back peri care, reporting she was unable to let go of RW. Pt participated in grooming ADLs while standing at sink with min guard assist and using one UE on RW for support. Continue to recommend SNF. However, if patient continues to progress and family is able to provide 24/7 assist at home, may consider [facility] discharge plan. Will continue to follow acutely.  Follow Up Recommendations  SNF    Equipment Recommendations  3 in 1 bedside commode;Other (comment);Hospital bed(RW)    Recommendations for Other Services      Precautions / Restrictions Precautions Precautions: Fall Precaution Comments: Jp drain Restrictions Weight Bearing Restrictions: No       Mobility Bed Mobility Overal bed mobility: Needs Assistance Bed Mobility: Rolling;Sidelying to Sit Rolling: Min assist Sidelying to sit: Min assist;+2 for physical assistance       General bed mobility comments: Pt able to brings LEs to edge of bed, requires +2 assist to elevate trunk OOB.  Transfers Overall transfer level: Needs assistance Equipment used: Rolling walker (2 wheeled) Transfers: Sit to/from Stand Sit to Stand: Min assist;+2 physical assistance         General transfer comment: Cues for hand placement to push from bed, patient able to recall sequencing to return to seated surface with good eccentric loading.       Balance                           ADL either performed or assessed with clinical judgement   ADL Overall ADL's : Needs assistance/impaired     Grooming: Brushing hair;Wash/dry hands;Wash/dry face;Oral care;Min guard;Standing Grooming Details (indicate cue type and reason): standing at sink                     Toileting- Water quality scientist and Hygiene: Total assistance Toileting - Clothing Manipulation Details (indicate cue type and reason): Pt with stool incontinence in bed. While pt stood with UEs supported on RW, therapist provided back peri care.      Functional mobility during ADLs: Minimal assistance;Rolling walker;+2 for safety/equipment General ADL Comments: Pt requiring assist/cues for safety with managing RW during functional mobility. She walked from bed to bathroom and performed grooming while standing at sink. She then walked to recliner. Once in recliner, she requested her notepad of paper. She then ripped out 4-5 pages and ripped up the blank sheets of paper for 5 minutes, then requested therapist to throw scraps of paper away.     Vision       Perception     Praxis      Cognition Arousal/Alertness: Awake/alert Behavior During Therapy: Flat affect Overall Cognitive Status: Impaired/Different from baseline Area of Impairment: Orientation;Attention;Memory;Following commands;Safety/judgement;Problem solving;Awareness                 Orientation Level: Disoriented to;Place;Time;Situation Current Attention Level: Focused Memory: Decreased recall of precautions;Decreased short-term memory Following Commands: Follows  one step commands inconsistently;Follows one step commands with increased time Safety/Judgement: Decreased awareness of safety;Decreased awareness of deficits Awareness: Intellectual Problem Solving: Decreased initiation;Difficulty sequencing;Requires verbal cues;Requires tactile cues;Slow processing General Comments: Pt more  appropriate during session but easily distracted with perseveration.          Exercises   Shoulder Instructions       General Comments      Pertinent Vitals/ Pain       Pain Assessment: Faces Faces Pain Scale: Hurts little more Pain Location: abdomen during mobility Pain Descriptors / Indicators: Sore;Grimacing;Guarding Pain Intervention(s): Monitored during session;Repositioned  Home Living                                          Prior Functioning/Environment              Frequency  Min 2X/week        Progress Toward Goals  OT Goals(current goals can now be found in the care plan section)  Progress towards OT goals: Progressing toward goals  Acute Rehab OT Goals Patient Stated Goal: To see her daughter OT Goal Formulation: Patient unable to participate in goal setting Time For Goal Achievement: 01/09/19 Potential to Achieve Goals: Good  Plan Discharge plan remains appropriate    Co-evaluation      Reason for Co-Treatment: Complexity of the patient's impairments (multi-system involvement);Necessary to address cognition/behavior during functional activity PT goals addressed during session: Mobility/safety with mobility OT goals addressed during session: ADL's and self-care      AM-PAC OT "6 Clicks" Daily Activity     Outcome Measure   Help from another person eating meals?: A Lot Help from another person taking care of personal grooming?: A Little Help from another person toileting, which includes using toliet, bedpan, or urinal?: A Lot Help from another person bathing (including washing, rinsing, drying)?: A Lot Help from another person to put on and taking off regular upper body clothing?: A Little Help from another person to put on and taking off regular lower body clothing?: A Lot 6 Click Score: 14    End of Session Equipment Utilized During Treatment: Rolling walker  OT Visit Diagnosis: Unsteadiness on feet (R26.81);Muscle  weakness (generalized) (M62.81)   Activity Tolerance Patient tolerated treatment well   Patient Left with call bell/phone within reach;in chair   Nurse Communication Mobility status        Time: 8502-7741 OT Time Calculation (min): 29 min  Charges: OT General Charges $OT Visit: 1 Visit OT Treatments $Self Care/Home Management : 8-22 mins      Jamespaul Secrist, [place] [phone] 12/27/2018, 4:38 PM

## 2018-12-27 NOTE — Progress Notes (Signed)
Patient agitated 2 NTs did incontinent care on her, this RN came and reoriented patient that she needed to be changed to keep her dry and comfortable, pt then eventually have the NTs changed her including linen change.  Provided warm blanket to patient and patient trying to get some sleep.  Will monitor.

## 2018-12-27 NOTE — Plan of Care (Signed)
  Problem: Safety: Goal: Ability to remain free from injury will improve Outcome: Progressing   

## 2018-12-27 NOTE — Progress Notes (Signed)
Patient assisted to the bedside commode.  Patient had a small bowel movement.

## 2018-12-28 MED ORDER — ATORVASTATIN CALCIUM 10 MG PO TABS
10.0000 mg | ORAL_TABLET | Freq: Every day | ORAL | Status: DC
  Administered 2018-12-28: 10 mg via ORAL
  Filled 2018-12-28: qty 1

## 2018-12-28 MED ORDER — ACETAMINOPHEN 325 MG PO TABS
650.0000 mg | ORAL_TABLET | Freq: Four times a day (QID) | ORAL | Status: DC | PRN
  Administered 2018-12-29: 650 mg via ORAL
  Filled 2018-12-28: qty 2

## 2018-12-28 MED ORDER — ADULT MULTIVITAMIN W/MINERALS CH
1.0000 | ORAL_TABLET | Freq: Every day | ORAL | Status: DC
  Administered 2018-12-28: 1 via ORAL
  Filled 2018-12-28 (×2): qty 1

## 2018-12-28 MED ORDER — ACETAMINOPHEN 650 MG RE SUPP: 650.0000 mg | Freq: Four times a day (QID) | RECTAL | Status: DC | PRN

## 2018-12-28 NOTE — Progress Notes (Signed)
Called and spoke to patient's nurse, [place] regarding patient's POC. Discussed placing midline. Rasheen RN declined midline placement at this time. Patient has low needs of IV infusions at this time with possible discharge tomorrow. Fran Lowes, RN VAST

## 2018-12-28 NOTE — Progress Notes (Addendum)
Initial Nutrition Assessment  RD working remotely.  DOCUMENTATION CODES:   Not applicable  INTERVENTION:   -Continue Ensure Enlive po BID, each supplement provides 350 kcal and 20 grams of protein -MVI with minerals daily  NUTRITION DIAGNOSIS:   Increased nutrient needs related to post-op healing as evidenced by estimated needs.  GOAL:   Patient will meet greater than or equal to 90% of their needs  MONITOR:   PO intake, Supplement acceptance, Labs, Weight trends, Skin, I & O's  REASON FOR ASSESSMENT:   LOS    ASSESSMENT:      Felicia Stephens is a 85 y.o. female with medical history significant of HTN, HLD.  Patient presents to the ED with onset of N/V and abd pain.  Started 3 days ago.  Has known large ventral hernia at baseline but never had SBO previously.  Large meal Sunday and progressive issues since then.  Pt admitted with ventral hernia with SBO.   4/16- NGT placed for decompression 4/17- s/p ex lap, small bowel resection, and primary hernia repair  4/20- NGT clamped 4/21- NGT d/c, advanced to sips and chips 4/22- advanced to clear liquid diet 4/23- advanced to soft diet, drain removed  Reviewed I/O's: +2.4 L x 24 hours and +7.1 L since admission  UOP: 100 ml x 24 hours  Drain output: 20 ml x 24 hours  Per nursing notes, pt is confused today and is fixated on things in her room becoming contaminated (she refused physical therapy treatment this morning due to this). RD deferred phone interview secondary to confusion. Unable to obtain further nutrition-related history at this time.   Pt was just advanced to soft diet this morning and no meal intake documentation available to assess at this time. Ensure supplements have been ordered and pt has been consuming per MAR.   Per wt hx, wt has been stable over the past several years.   Per MD notes, plan to discharge soon.   Labs reviewed.   NUTRITION - FOCUSED PHYSICAL EXAM:    Most Recent Value  Orbital  Region  Unable to assess  Upper Arm Region  Unable to assess  Thoracic and Lumbar Region  Unable to assess  Buccal Region  Unable to assess  Temple Region  Unable to assess  Clavicle Bone Region  Unable to assess  Clavicle and Acromion Bone Region  Unable to assess  Scapular Bone Region  Unable to assess  Dorsal Hand  Unable to assess  Patellar Region  Unable to assess  Anterior Thigh Region  Unable to assess  Posterior Calf Region  Unable to assess  Edema (RD Assessment)  Unable to assess  Hair  Unable to assess  Eyes  Unable to assess  Mouth  Unable to assess  Skin  Unable to assess  Nails  Unable to assess       Diet Order:   Diet Order            DIET SOFT Room service appropriate? Yes; Fluid consistency: Thin  Diet effective now              EDUCATION NEEDS:   Not appropriate for education at this time  Skin:  Skin Assessment: Skin Integrity Issues: Skin Integrity Issues:: Incisions Incisions: closed abdomen  Last BM:  12/27/18  Height:   Ht Readings from Last 1 Encounters:  12/22/18 5' 3.5" (1.613 m)    Weight:   Wt Readings from Last 1 Encounters:  12/22/18 72.6 kg  Ideal Body Weight:  53.4 kg  BMI:  Body mass index is 27.9 kg/m.  Estimated Nutritional Needs:   Kcal:  1650-1850  Protein:  80-95 grams  Fluid:  > 1.6 L    Navaya Wiatrek A. Jimmye Norman, RD, LDN, [place] Registered Dietitian II Certified Diabetes Care and Education Specialist Pager: [phone] After hours Pager: [phone]

## 2018-12-28 NOTE — Progress Notes (Signed)
PT Cancellation Note  Patient Details Name: Felicia Stephens MRN: [id number] DOB: 19-Apr-1933   Cancelled Treatment:    Reason Eval/Treat Not Completed: (P) Patient declined, no reason specified(Pt in a paranoid state and reports therapist cannot touch her because she was sent here to infiltrate her room and she doesn't know the color code.  )   Cristela Blue 12/28/2018, 11:29 AM  Governor Rooks, PTA Acute Rehabilitation Services Pager [phone] Office [phone]

## 2018-12-28 NOTE — Progress Notes (Signed)
Physical Therapy Treatment Patient Details Name: Felicia Stephens MRN: [id number] DOB: 30-Aug-1933 Today's Date: 12/28/2018    History of Present Illness 85 y/o female who presents with abdominal pain, and N/V, found to have Incarcerated ventral hernia s/p primary hernia repair with small bowel repair 4/17. PMH includes HTN, HLD.    PT Comments    Pt performed gait training and functional mobility with improved behavior.  She followed commands well but appears limited due to bowel incontinence.  Plan for return with HHPT.     Follow Up Recommendations  Home health PT;Supervision for mobility/OOB     Equipment Recommendations  Rolling walker with 5" wheels(if she does not have one)    Recommendations for Other Services       Precautions / Restrictions Precautions Precautions: Fall Precaution Comments: j/p drain removed 12/28/18 Restrictions Weight Bearing Restrictions: No    Mobility  Bed Mobility Overal bed mobility: Needs Assistance Bed Mobility: Rolling Rolling: Min assist Sidelying to sit: Mod assist       General bed mobility comments: Pt required assistance to bring legs to edge of bed and to elevate trunk into sitting.   Transfers Overall transfer level: Needs assistance Equipment used: Rolling walker (2 wheeled) Transfers: Sit to/from Stand Sit to Stand: Min assist         General transfer comment: Cues for hand placement to push from bed, patient able to recall sequencing to return to seated surface with good eccentric loading.    Ambulation/Gait Ambulation/Gait assistance: Min assist Gait Distance (Feet): 80 Feet Assistive device: Rolling walker (2 wheeled) Gait Pattern/deviations: Step-through pattern;Trunk flexed;Decreased stride length Gait velocity: decreased   General Gait Details: Cues for upper trunk control and forward gaze.  pt able to progress gait distance but presents with stool incontinence during session.     Stairs              Wheelchair Mobility    Modified Rankin (Stroke Patients Only)       Balance Overall balance assessment: Needs assistance   Sitting balance-Leahy Scale: Fair       Standing balance-Leahy Scale: Poor                              Cognition Arousal/Alertness: Awake/alert Behavior During Therapy: Flat affect Overall Cognitive Status: Impaired/Different from baseline Area of Impairment: Memory;Safety/judgement;Following commands                     Memory: Decreased recall of precautions;Decreased short-term memory Following Commands: Follows one step commands inconsistently;Follows one step commands with increased time Safety/Judgement: Decreased awareness of safety;Decreased awareness of deficits   Problem Solving: Decreased initiation;Difficulty sequencing;Requires verbal cues;Requires tactile cues;Slow processing General Comments: Pt more appropriate during session but easily distracted with perseveration.        Exercises      General Comments        Pertinent Vitals/Pain Pain Assessment: Faces Pain Score: 0-No pain    Home Living                      Prior Function            PT Goals (current goals can now be found in the care plan section) Acute Rehab PT Goals Patient Stated Goal: To see her daughter Potential to Achieve Goals: Fair Progress towards PT goals: Progressing toward goals    Frequency    Min 3X/week  PT Plan Current plan remains appropriate    Co-evaluation              AM-PAC PT "6 Clicks" Mobility   Outcome Measure  Help needed turning from your back to your side while in a flat bed without using bedrails?: A Little Help needed moving from lying on your back to sitting on the side of a flat bed without using bedrails?: A Little Help needed moving to and from a bed to a chair (including a wheelchair)?: A Little Help needed standing up from a chair using your arms (e.g., wheelchair or  bedside chair)?: A Little Help needed to walk in hospital room?: A Little Help needed climbing 3-5 steps with a railing? : A Lot 6 Click Score: 17    End of Session Equipment Utilized During Treatment: Gait belt Activity Tolerance: Patient tolerated treatment well Patient left: in chair;with call bell/phone within reach;with chair alarm set Nurse Communication: Mobility status PT Visit Diagnosis: Pain;Difficulty in walking, not elsewhere classified (R26.2);Muscle weakness (generalized) (M62.81);Unsteadiness on feet (R26.81) Pain - part of body: (abdomen)     Time: 7989-2119 PT Time Calculation (min) (ACUTE ONLY): 32 min  Charges:  $Gait Training: 8-22 mins $Therapeutic Activity: 8-22 mins                     Governor Rooks, PTA Acute Rehabilitation Services Pager [phone] Office [phone]     Felicia Stephens 12/28/2018, 3:46 PM

## 2018-12-28 NOTE — Discharge Instructions (Signed)
[facility], [place] [phone]  OPEN ABDOMINAL SURGERY: POST OP INSTRUCTIONS  Always review your discharge instruction sheet given to you by the facility where your surgery was performed.  IF YOU HAVE DISABILITY OR FAMILY LEAVE FORMS, YOU MUST BRING THEM TO THE OFFICE FOR PROCESSING.  PLEASE DO NOT GIVE THEM TO YOUR DOCTOR.  1. A prescription for pain medication may be given to you upon discharge.  Take your pain medication as prescribed, if needed.  If narcotic pain medicine is not needed, then you may take acetaminophen (Tylenol) or ibuprofen (Advil) as needed. 2. Take your usually prescribed medications unless otherwise directed. 3. If you need a refill on your pain medication, please contact your pharmacy. They will contact our office to request authorization.  Prescriptions will not be filled after 5pm or on week-ends. 4. You should follow a light diet the first few days after arrival home, such as soup and crackers, pudding, etc.unless your doctor has advised otherwise. A high-fiber, low fat diet can be resumed as tolerated.   Be sure to include lots of fluids daily. Most patients will experience some swelling and bruising on the chest and neck area.  Ice packs will help.  Swelling and bruising can take several days to resolve 5. Most patients will experience some swelling and bruising in the area of the incision. Ice pack will help. Swelling and bruising can take several days to resolve..  6. It is common to experience some constipation if taking pain medication after surgery.  Increasing fluid intake and taking a stool softener will usually help or prevent this problem from occurring.  A mild laxative (Milk of Magnesia or Miralax) should be taken according to package directions if there are no bowel movements after 48 hours. 7.  You may have steri-strips (small skin tapes) in place directly over the incision.  These strips should be left on the skin for 7-10 days.  If your  surgeon used skin glue on the incision, you may shower in 24 hours.  The glue will flake off over the next 2-3 weeks.  Any sutures or staples will be removed at the office during your follow-up visit. You may find that a light gauze bandage over your incision may keep your staples from being rubbed or pulled. You may shower and replace the bandage daily. 8. ACTIVITIES:  You may resume regular (light) daily activities beginning the next day--such as daily self-care, walking, climbing stairs--gradually increasing activities as tolerated.  You may have sexual intercourse when it is comfortable.  Refrain from any heavy lifting or straining until approved by your doctor. a. You may drive when you no longer are taking prescription pain medication, you can comfortably wear a seatbelt, and you can safely maneuver your car and apply brakes 9. You should see your doctor in the office for a follow-up appointment approximately two weeks after your surgery.  Make sure that you call for this appointment within a day or two after you arrive home to insure a convenient appointment time.   WHEN TO CALL YOUR DOCTOR: 1. Fever over 101.0 2. Inability to urinate 3. Nausea and/or vomiting 4. Extreme swelling or bruising 5. Continued bleeding from incision. 6. Increased pain, redness, or drainage from the incision. 7. Difficulty swallowing or breathing 8. Muscle cramping or spasms. 9. Numbness or tingling in hands or feet or around lips.  The clinic staff is available to answer your questions during regular business hours.  Please  dont hesitate to call and ask to speak to one of the nurses if you have concerns.  For further questions, please visit [email or web address]  MIDLINE WOUND CARE: - midline dressing to be changed twice daily - supplies: sterile saline, kerlix, scissors, ABD pads, tape  - remove dressing and all packing carefully, moistening with sterile saline as needed to avoid packing/internal  dressing sticking to the wound. - clean edges of skin around the wound with water/gauze, making sure there is no tape debris or leakage left on skin that could cause skin irritation or breakdown. - dampen and clean kerlix with sterile saline and pack wound from wound base to skin level, making sure to take note of any possible areas of wound tracking, tunneling and packing appropriately. Wound can be packed loosely. Trim kerlix to size if a whole kerlix is not required. - cover wound with a dry ABD pad and secure with tape.  - write the date/time on the dry dressing/tape to better track when the last dressing change occurred. - apply any skin protectant/powder recommended by clinician to protect skin/skin folds. - change dressing as needed if leakage occurs, wound gets contaminated, or patient requests to shower. - patient may shower daily with wound open and following the shower the wound should be dried and a clean dressing placed.

## 2018-12-28 NOTE — Progress Notes (Signed)
PROGRESS NOTE    Felicia Stephens  HWE:[id number] DOB: [birth year] DOA: 12/20/2018 PCP: Lajean Manes, MD  Brief Narrative:  85 year old female with PMH of HLD, HTN, large ventral incisional hernia presented to [facility] ED on 4/15 due to 3 days history of acute onset of abdominal pain, nausea and vomiting.  She was initially admitted for SBO due to large hernia.  Failed conservative management including bowel rest and NG tube.  On 4/17, general surgery performed emergent exploratory laparotomy, small bowel resection and hernia repair for incarcerated ventral hernia with ischemic bowel.  Ongoing postop ileus.  Mobilizing.  Awaiting return of bowel function to advance diet.   Assessment & Plan:   Principal Problem:   Ventral hernia with bowel obstruction Active Problems:   HTN (hypertension)  Better ventral hernia with ischemic bowel Failed  conservative measures Patient underwent primary hernia repair with small bowel resection on 4/17.by Dr Rosendo Gros.  Surgery on board and appreciate their assistance. Started on clears, and advance diet as tolerated. Patient having Bowel movements.  Continue with IV zosyn. Drain with only 20 cc out, hence was removed at bedside on 4/23.  Pt denies any nausea, vomiting or abdominal pain.    Acute metabolic encephalopathy Multifactorial due to acute medical illness including AKI, sleep deprivation, postop delirium, hospital delirium, pain and opioid use.  As per the family patient does not have a history of dementia. MRI brain, does not show any acute stroke.     Hypertension Well controlled.    Dehydration and hyponatremia Have improved    Acute blood loss early anemia Secondary to postop blood loss. Transfuse  to keep hemoglobin greater than 7.  AKI Resolved with hydration.   Hyperlipidemia Restarted the lipitor.    DVT prophylaxis: lovenox Code Status: Full code Family Communication: None at bedside. Spoke to daughter over the phone.   Disposition Plan: Pending return of bowel function and resolution of acute encephalopathy   Consultants:   Surgery  Gastroenterology   Procedures: Hernia repair with small bowel section  Antimicrobials: Zosyn  Subjective: Frustrated that she is soiled . Had a large BM today.   Objective: Vitals:   12/27/18 1133 12/27/18 1456 12/27/18 1953 12/28/18 0327  BP: (!) 170/81 (!) 146/68 (!) 151/61 (!) 153/60  Pulse:  85 85 80  Resp:  16 18 17   Temp:  97.8 F (36.6 C) 98.4 F (36.9 C) 98.3 F (36.8 C)  TempSrc:  Oral Oral Oral  SpO2:  100% 98% 97%  Weight:      Height:        Intake/Output Summary (Last 24 hours) at 12/28/2018 1308 Last data filed at 12/28/2018 8938 Gross per 24 hour  Intake 2475.72 ml  Output 123 ml  Net 2352.72 ml   Filed Weights   12/22/18 0946  Weight: 72.6 kg    Examination:  General exam: Appears calm and comfortable . Not in distress.  Respiratory system: Clear to auscultation. Respiratory effort normal. Cardiovascular system: S1 & S2 heard, RRR.  Gastrointestinal system: Abdomen is soft, tender, bowel sounds are good  Central nervous system: Alert and pleasantly confused, able to move all extremities, no focal deficits Extremities: Symmetric 5 x 5 power. Skin: No rashes, lesions or ulcers Psychiatry: Pleasantly confused    Data Reviewed: I have personally reviewed following labs and imaging studies  CBC: Recent Labs  Lab 12/22/18 0118 12/23/18 0633 12/24/18 0326 12/25/18 0529 12/26/18 0330  WBC 9.9 12.6* 12.2* 7.6 7.3  HGB 12.5 13.2 11.0* 11.4*  11.4*  HCT 38.6 40.8 33.8* 34.0* 35.1*  MCV 92.1 92.3 93.4 92.6 92.4  PLT 241 257 246 258 376   Basic Metabolic Panel: Recent Labs  Lab 12/22/18 0118  12/23/18 0321 12/24/18 0326 12/25/18 0529 12/26/18 0330 12/27/18 0351  NA 134*   < > 135 138 136 137 139  K 3.1*   < > 4.6 3.5 3.2* 4.1 4.0  CL 103   < > 106 106 104 107 107  CO2 25   < > 19* 23 26 22 22   GLUCOSE 108*   < >  137* 183* 160* 125* 136*  BUN 52*   < > 46* 34* 21 16 13   CREATININE 1.09*   < > 1.19* 0.78 0.63 0.69 0.69  CALCIUM 10.4*   < > 10.1 10.0 9.5 9.1 9.4  MG 1.8  --   --   --   --   --   --    < > = values in this interval not displayed.   GFR: Estimated Creatinine Clearance: 51.5 mL/min (by C-G formula based on SCr of 0.69 mg/dL). Liver Function Tests: Recent Labs  Lab 12/24/18 0326 12/25/18 0529  AST 36 30  ALT 18 19  ALKPHOS 70 63  BILITOT 2.6* 1.6*  PROT 5.0* 5.1*  ALBUMIN 1.6* 1.6*   No results for input(s): LIPASE, AMYLASE in the last 168 hours. No results for input(s): AMMONIA in the last 168 hours. Coagulation Profile: No results for input(s): INR, PROTIME in the last 168 hours. Cardiac Enzymes: No results for input(s): CKTOTAL, CKMB, CKMBINDEX, TROPONINI in the last 168 hours. BNP (last 3 results) No results for input(s): PROBNP in the last 8760 hours. HbA1C: No results for input(s): HGBA1C in the last 72 hours. CBG: No results for input(s): GLUCAP in the last 168 hours. Lipid Profile: No results for input(s): CHOL, HDL, LDLCALC, TRIG, CHOLHDL, LDLDIRECT in the last 72 hours. Thyroid Function Tests: No results for input(s): TSH, T4TOTAL, FREET4, T3FREE, THYROIDAB in the last 72 hours. Anemia Panel: No results for input(s): VITAMINB12, FOLATE, FERRITIN, TIBC, IRON, RETICCTPCT in the last 72 hours. Sepsis Labs: No results for input(s): PROCALCITON, LATICACIDVEN in the last 168 hours.  Recent Results (from the past 240 hour(s))  Blood culture (routine x 2)     Status: None   Collection Time: 12/20/18 10:36 PM  Result Value Ref Range Status   Specimen Description SITE NOT SPECIFIED  Final   Special Requests   Final    BOTTLES DRAWN AEROBIC AND ANAEROBIC Blood Culture results may not be optimal due to an inadequate volume of blood received in culture bottles   Culture   Final    NO GROWTH 5 DAYS Performed at [facility], [place] [address], [place], [place]  [address]    Report Status 12/25/2018 FINAL  Final  Blood culture (routine x 2)     Status: None   Collection Time: 12/20/18 10:38 PM  Result Value Ref Range Status   Specimen Description BLOOD LEFT ANTECUBITAL  Final   Special Requests   Final    BOTTLES DRAWN AEROBIC AND ANAEROBIC Blood Culture results may not be optimal due to an inadequate volume of blood received in culture bottles   Culture   Final    NO GROWTH 5 DAYS Performed at [facility], [place] [address] [place], [place] [address]    Report Status 12/25/2018 FINAL  Final  Surgical pcr screen     Status: Abnormal  Collection Time: 12/22/18  8:46 AM  Result Value Ref Range Status   MRSA, PCR NEGATIVE NEGATIVE Final   Staphylococcus aureus POSITIVE (A) NEGATIVE Final    Comment: (NOTE) The Xpert SA Assay (FDA approved for NASAL specimens in patients 53 years of age and older), is one component of a comprehensive surveillance program. It is not intended to diagnose infection nor to guide or monitor treatment. Performed at [facility], [place] [address], [place], [place] [address]          Radiology Studies: Mr Brain Wo Contrast  Result Date: 12/26/2018 CLINICAL DATA:  Status post ventral hernia repair, postoperative confusion. EXAM: MRI HEAD WITHOUT CONTRAST TECHNIQUE: Multiplanar, multiecho pulse sequences of the brain and surrounding structures were obtained without intravenous contrast. COMPARISON:  None. FINDINGS: Brain: No acute infarction, hemorrhage, hydrocephalus, extra-axial collection or mass lesion. Moderate cerebral and cerebellar atrophy, not unexpected for age. Moderately advanced subcortical and periventricular T2 and FLAIR hyperintensities, likely chronic microvascular ischemic change. Vascular: Normal flow voids. Skull and upper cervical spine: Normal marrow signal. Sinuses/Orbits: Negative. Other: None. IMPRESSION: Moderate to advanced atrophy and small vessel disease. No acute intracranial  findings. No evidence for occult postoperative infarction or intracranial hemorrhage. Electronically Signed   By: Staci Righter M.D.   On: 12/26/2018 18:25        Scheduled Meds: . Chlorhexidine Gluconate Cloth  6 each Topical Daily  . enoxaparin (LOVENOX) injection  40 mg Subcutaneous Q24H  . feeding supplement (ENSURE ENLIVE)  237 mL Oral BID BM  . fluticasone  1 spray Each Nare Daily  . hydrochlorothiazide  12.5 mg Oral Daily  . lisinopril  5 mg Oral Daily  . multivitamin with minerals  1 tablet Oral Daily  . mupirocin ointment  1 application Nasal BID   Continuous Infusions: . dextrose 5 % and 0.9 % NaCl with KCl 40 mEq/L 10 mL/hr at 12/28/18 0904     LOS: 7 days    Time spent: 32 minutes    Hosie Poisson, MD [facility] Pager [phone]  If 7PM-7AM, please contact night-coverage [email or web address] Password [email or web address] 12/28/2018, 1:08 PM

## 2018-12-28 NOTE — Plan of Care (Signed)
  Problem: Elimination: Goal: Will not experience complications related to bowel motility Outcome: Progressing Goal: Will not experience complications related to urinary retention Outcome: Progressing   Problem: Elimination: Goal: Will not experience complications related to urinary retention Outcome: Progressing

## 2018-12-28 NOTE — Progress Notes (Signed)
Called and discussed patient progress and surgical follow up with patient's daughter Carmesha [place]. She and her sister will plan to meet with me in patient room tomorrow at 10:00 AM for teaching on wound care prior to discharge tomorrow.   Brigid Re , [facility] 12/28/2018, 2:42 PM Pager: [phone]

## 2018-12-28 NOTE — Progress Notes (Signed)
[facility] Progress Note  6 Days Post-Op  Subjective: CC: confusion Patient able to tell me who she is, where she is and what year it is but told me she came to the hospital for infection. She was very concerned about making sure nothing became "contaminated" in the room. Denies abdominal pain. Tells me she does not have much appetite. Started having bowel function overnight and had a large BM this AM.   Objective: Vital signs in last 24 hours: Temp:  [97.8 F (36.6 C)-98.4 F (36.9 C)] 98.3 F (36.8 C) (04/23 0327) Pulse Rate:  [80-85] 80 (04/23 0327) Resp:  [16-18] 17 (04/23 0327) BP: (146-170)/(60-81) 153/60 (04/23 0327) SpO2:  [97 %-100 %] 97 % (04/23 0327) Last BM Date: 12/27/18  Intake/Output from previous day: 04/22 0701 - 04/23 0700 In: 2475.7 [P.O.:600; I.V.:1724.7; IV Piggyback:151] Out: 122 [Urine:100; Drains:20; Stool:2] Intake/Output this shift: No intake/output data recorded.  PE: Gen: Alert, NAD, pleasant Card: Regular rate and rhythm Pulm: Normal effort, clear to auscultation bilaterally Abd: Soft,appropriatelytender, non-distended, +BS, midline wound clean, drain with SS fluid (removed) Skin: warm and dry, no rashes  Lab Results:  Recent Labs    12/26/18 0330  WBC 7.3  HGB 11.4*  HCT 35.1*  PLT 242   BMET Recent Labs    12/26/18 0330 12/27/18 0351  NA 137 139  K 4.1 4.0  CL 107 107  CO2 22 22  GLUCOSE 125* 136*  BUN 16 13  CREATININE 0.69 0.69  CALCIUM 9.1 9.4   PT/INR No results for input(s): LABPROT, INR in the last 72 hours. CMP     Component Value Date/Time   NA 139 12/27/2018 0351   K 4.0 12/27/2018 0351   CL 107 12/27/2018 0351   CO2 22 12/27/2018 0351   GLUCOSE 136 (H) 12/27/2018 0351   BUN 13 12/27/2018 0351   CREATININE 0.69 12/27/2018 0351   CALCIUM 9.4 12/27/2018 0351   PROT 5.1 (L) 12/25/2018 0529   ALBUMIN 1.6 (L) 12/25/2018 0529   AST 30 12/25/2018 0529   ALT 19 12/25/2018 0529   ALKPHOS 63  12/25/2018 0529   BILITOT 1.6 (H) 12/25/2018 0529   GFRNONAA >60 12/27/2018 0351   GFRAA >60 12/27/2018 0351   Lipase     Component Value Date/Time   LIPASE 23 12/20/2018 2039       Studies/Results: Mr Brain Wo Contrast  Result Date: 12/26/2018 CLINICAL DATA:  Status post ventral hernia repair, postoperative confusion. EXAM: MRI HEAD WITHOUT CONTRAST TECHNIQUE: Multiplanar, multiecho pulse sequences of the brain and surrounding structures were obtained without intravenous contrast. COMPARISON:  None. FINDINGS: Brain: No acute infarction, hemorrhage, hydrocephalus, extra-axial collection or mass lesion. Moderate cerebral and cerebellar atrophy, not unexpected for age. Moderately advanced subcortical and periventricular T2 and FLAIR hyperintensities, likely chronic microvascular ischemic change. Vascular: Normal flow voids. Skull and upper cervical spine: Normal marrow signal. Sinuses/Orbits: Negative. Other: None. IMPRESSION: Moderate to advanced atrophy and small vessel disease. No acute intracranial findings. No evidence for occult postoperative infarction or intracranial hemorrhage. Electronically Signed   By: Staci Righter M.D.   On: 12/26/2018 18:25    Anti-infectives: Anti-infectives (From admission, onward)   Start     Dose/Rate Route Frequency Ordered Stop   12/22/18 1400  piperacillin-tazobactam (ZOSYN) IVPB 3.375 g     3.375 g 12.5 mL/hr over 240 Minutes Intravenous Every 8 hours 12/22/18 1332     12/22/18 1100  cefoTEtan in Dextrose 5% (CEFOTAN) IVPB 2 g  Status:  Discontinued     2 g 100 mL/hr over 30 Minutes Intravenous Every 12 hours 12/22/18 1047 12/22/18 1315   12/20/18 2130  piperacillin-tazobactam (ZOSYN) IVPB 3.375 g     3.375 g 100 mL/hr over 30 Minutes Intravenous  Once 12/20/18 2124 12/20/18 2329       Assessment/Plan HTN HLD ABL amemia- H/H [birth year] (4/21), stable Acute metabolic encephalopathy AKI- Cr 0.69 yesterday,stable Hypokalemia - K4.0  yestrdat,improved  Incarcerated ventral hernia - s/p primary hernia repair with SBRx1 Dr. Rosendo Gros 12/22/18 - POD#6 -continueBID dressing changes - advance to SOFT diet  -continuePT/OT - drain with 20 cc SS out - drain removed at bedside today   FEN: CLD, IVF VTE: SCDs, lovenox ID: Zosyn 4/15>4/23 will stop abx today   LOS: 7 days    Felicia Stephens , [facility] 12/28/2018, 7:42 AM Pager: [place]: [phone]

## 2018-12-28 NOTE — Progress Notes (Signed)
Daughter called and wanted transport chair added , NCM referred to  [place]. He will bring the transport chair up to patient's room , daughter states she is for dc tomorrow.

## 2018-12-29 MED ORDER — ENSURE ENLIVE PO LIQD: 237.0000 mL | Freq: Two times a day (BID) | ORAL | 12 refills | Status: AC

## 2018-12-29 NOTE — Plan of Care (Signed)
  Problem: Clinical Measurements: °Goal: Ability to maintain clinical measurements within normal limits will improve °Outcome: Progressing °  °Problem: Activity: °Goal: Risk for activity intolerance will decrease °Outcome: Progressing °  °Problem: Elimination: °Goal: Will not experience complications related to bowel motility °Outcome: Progressing °  °Problem: Pain Managment: °Goal: General experience of comfort will improve °Outcome: Progressing °  °

## 2018-12-29 NOTE — Progress Notes (Addendum)
Physical Therapy Treatment Patient Details Name: Felicia Stephens MRN: [id number] DOB: 04/06/1933 Today's Date: 12/29/2018    History of Present Illness 85 y/o female who presents with abdominal pain, and N/V, found to have Incarcerated ventral hernia s/p primary hernia repair with small bowel repair 4/17. PMH includes HTN, HLD.    PT Comments    Pt seated in recliner on arrival.  Daughter present at bed side and reports she is normally clear minded at baseline.  Pt continues to have bowel incontinence and require assistance back to bed for dressing change as PA arrived to teach family about home dressing changes.  Will return to review HEP.      Follow Up Recommendations  Home health PT;Supervision for mobility/OOB     Equipment Recommendations  Rolling walker with 5" wheels(if she does not have one)    Recommendations for Other Services       Precautions / Restrictions Precautions Precautions: Fall Precaution Comments: j/p drain removed 12/28/18 Restrictions Weight Bearing Restrictions: No    Mobility  Bed Mobility Overal bed mobility: Needs Assistance Bed Mobility: Rolling    Sit to supine: Max assist   General bed mobility comments: Pt required assistance to bring legs to edge of bed and to elevate trunk into sitting. Required assistance to lift LE back into bed against gravity.  Multiple bouts of rolling due to bowel incontinence.    Transfers Overall transfer level: Needs assistance Equipment used: Rolling walker (2 wheeled) Transfers: Sit to/from Stand Sit to Stand: Min assist Stand pivot transfers: Min assist       General transfer comment: Cues for hand placement to push from bed, patient able to recall sequencing to return to seated surface with good eccentric loading.    Ambulation/Gait Ambulation/Gait assistance: Min assist Gait Distance (Feet): 10 Feet Assistive device: Rolling walker (2 wheeled) Gait Pattern/deviations: Step-through pattern;Trunk  flexed;Decreased stride length Gait velocity: decreased   General Gait Details: Assistance to ambulate back to bed for dressing change.  Pt required assistance for turning and backing.     Stairs             Wheelchair Mobility    Modified Rankin (Stroke Patients Only)       Balance     Sitting balance-Leahy Scale: Fair       Standing balance-Leahy Scale: Poor                              Cognition Arousal/Alertness: Awake/alert Behavior During Therapy: Flat affect Overall Cognitive Status: Impaired/Different from baseline Area of Impairment: Memory;Safety/judgement;Following commands                 Orientation Level: Disoriented to;Place;Time;Situation   Memory: Decreased recall of precautions;Decreased short-term memory Following Commands: Follows one step commands inconsistently;Follows one step commands with increased time Safety/Judgement: Decreased awareness of safety;Decreased awareness of deficits Awareness: Intellectual Problem Solving: Decreased initiation;Difficulty sequencing;Requires verbal cues;Requires tactile cues;Slow processing General Comments: Pt more appropriate during session but easily distracted with perseveration.  She performed better with daughters present.      Exercises    General Comments        Pertinent Vitals/Pain Pain Assessment: Faces Pain Location: abdomen during mobility Pain Descriptors / Indicators: Sore;Grimacing;Guarding Pain Intervention(s): Monitored during session;Repositioned    Home Living                      Prior Function  PT Goals (current goals can now be found in the care plan section) Acute Rehab PT Goals Patient Stated Goal: To see her daughter Potential to Achieve Goals: Fair Progress towards PT goals: Progressing toward goals    Frequency    Min 3X/week      PT Plan Current plan remains appropriate    Co-evaluation              AM-PAC  PT "6 Clicks" Mobility   Outcome Measure  Help needed turning from your back to your side while in a flat bed without using bedrails?: A Little Help needed moving from lying on your back to sitting on the side of a flat bed without using bedrails?: A Little Help needed moving to and from a bed to a chair (including a wheelchair)?: A Little Help needed standing up from a chair using your arms (e.g., wheelchair or bedside chair)?: A Little Help needed to walk in hospital room?: A Little Help needed climbing 3-5 steps with a railing? : A Lot 6 Click Score: 17    End of Session Equipment Utilized During Treatment: Gait belt Activity Tolerance: Patient tolerated treatment well Patient left: in chair;with call bell/phone within reach;with chair alarm set Nurse Communication: Mobility status PT Visit Diagnosis: Pain;Difficulty in walking, not elsewhere classified (R26.2);Muscle weakness (generalized) (M62.81);Unsteadiness on feet (R26.81) Pain - part of body: (abdomen)     Time: 3151-7616 PT Time Calculation (min) (ACUTE ONLY): 9 min  Charges:  $Therapeutic Activity: 8-22 mins                     Governor Rooks, PTA Acute Rehabilitation Services Pager [phone] Office [phone]     Yenny Kosa Eli Hose 12/29/2018, 2:25 PM

## 2018-12-29 NOTE — Discharge Summary (Signed)
Physician Discharge Summary  Felicia Stephens CMK:[id number] DOB: 24-Jun-1933 DOA: 12/20/2018  PCP: Lajean Manes, MD  Admit date: 12/20/2018 Discharge date: 12/29/2018  Admitted From: Home.  Disposition:  Home.   Recommendations for Outpatient Follow-up:  1. Follow up with PCP in 1-2 weeks 2. Please obtain BMP/CBC in one week 3. Please follow up with surgery recommendations regarding the dressing changes.   Home Health:YES  Discharge Condition:stable.  CODE STATUS:FULL CODE.  Diet recommendation: Heart Healthy / SOFT DIET.   Brief/Interim Summary: 85 year old female with PMH of HLD, HTN, large ventral incisional hernia presented [facility] ED on 4/15 due to 3 days history of acute onset of abdominal pain, nausea and vomiting. She was initially admitted for SBO due to large hernia. Failed conservative management including bowel rest and NG tube. On 4/17, general surgery performed emergent exploratory laparotomy, small bowel resection and hernia repair for incarcerated ventral hernia with ischemic bowel.Mobilizing. advanced diet and able to tolerate without any nausea, vomiting and abdominal pain.   Discharge Diagnoses:  Principal Problem:   Ventral hernia with bowel obstruction Active Problems:   HTN (hypertension)  Incarcerated ventral hernia with ischemic bowel Failed  conservative measures Patient underwent primary hernia repair with small bowel resection on 4/17.by Dr Rosendo Gros.  Surgery on board and appreciate their assistance. Started on clears and currently on soft diet without any nausea or vomiting or abdominal pain. Drain with only 20 cc out, hence was removed at bedside on 4/23.  Pt denies any nausea, vomiting or abdominal pain.    Acute metabolic encephalopathy Multifactorial due to acute medical illness including AKI, sleep deprivation, postop delirium, hospital delirium, pain and opioid use.  As per the family patient does not have a history of dementia. MRI brain, does  not show any acute stroke.  She appears to be back to baseline.     Hypertension Well controlled.    Dehydration and hyponatremia Have improved    Acute blood loss early anemia Secondary to postop blood loss. Transfuse  to keep hemoglobin greater than 7. Hemoglobin stable around 11.   AKI Resolved with hydration.   Hyperlipidemia Restarted the lipitor.   Discharge Instructions  Discharge Instructions    Diet - low sodium heart healthy   Complete by:  As directed    Discharge instructions   Complete by:  As directed    PLEASE follow up with surgery as recommended.  Please follow up with PCP in 1 to 2 weeks.     Allergies as of 12/29/2018   No Known Allergies     Medication List    TAKE these medications   atorvastatin 10 MG tablet Commonly known as:  LIPITOR Take 5 mg by mouth daily at 6 PM.   feeding supplement (ENSURE ENLIVE) Liqd Take 237 mLs by mouth 2 (two) times daily between meals.   fluticasone 50 MCG/ACT nasal spray Commonly known as:  FLONASE Place 1 spray into both nostrils daily as needed for allergies.   hydrochlorothiazide 12.5 MG tablet Commonly known as:  HYDRODIURIL Take 12.5 mg by mouth daily.   lisinopril 5 MG tablet Commonly known as:  ZESTRIL Take 5 mg by mouth daily.   loratadine 10 MG tablet Commonly known as:  CLARITIN Take 10 mg by mouth daily.   multivitamin tablet Take 1 tablet by mouth daily.            Durable Medical Equipment  (From admission, onward)         Start  Ordered   12/28/18 1355  For home use only DME Other see comment  Once    Comments:  Transport chair   12/28/18 1355   12/25/18 1546  For home use only DME Walker rolling  Once    Comments:  With 5 inch wheels  Question:  Patient needs a walker to treat with the following condition  Answer:  Physical deconditioning   12/25/18 1546         Follow-up Information    [facility] Follow up.   Specialty:   [place] Why:  A representative from [facility] will contact you to arrange start date and time for your therapy.       [facility] Follow up.   Why:  [facility] will deliver Rolling walker to room prior to your discharge and transport chair.       Ralene Ok, MD. Go on 01/19/2019.   Specialty:  General Surgery Why:  Appointment scheduled for 9:00 AM. Please arrive 30 min prior to appointment time. Bring photo ID and insurance information.  Contact information: [address] [place] [address] [phone]        Lajean Manes, MD. Schedule an appointment as soon as possible for a visit in 2 week(s).   Specialty:  Internal Medicine Contact information: [address] [place] [address] [place] [address] [phone]          No Known Allergies  Consultations:  Surgery.    Procedures/Studies: Ct Abdomen Pelvis Wo Contrast  Result Date: 12/20/2018 CLINICAL DATA:  85 y/o  F; abdominal distention, nausea, vomiting. EXAM: CT ABDOMEN AND PELVIS WITHOUT CONTRAST TECHNIQUE: Multidetector CT imaging of the abdomen and pelvis was performed following the standard protocol without IV contrast. COMPARISON:  None. FINDINGS: Lower chest: No acute abnormality. Hepatobiliary: No focal liver abnormality is seen. Status post cholecystectomy. No biliary dilatation. Pancreas: Unremarkable. No pancreatic ductal dilatation or surrounding inflammatory changes. Spleen: Normal in size without focal abnormality. Adrenals/Urinary Tract: Left kidney cysts measuring up to 3.1 cm. Bilateral kidney stones the largest measuring 10 mm in lower pole of right kidney. No ureter stone or hydronephrosis. Normal adrenal glands. Normal bladder. Stomach/Bowel: To the left and inferior of the umbilicus is a small hernia with 4 cm neck containing a loop of transverse colon without proximal obstruction. Right lateral to the umbilicus is a  large hernia with 4.3 cm neck intending the cecum and terminal ileum. Upstream small bowel is dilated with fluid levels indicating obstruction. The stomach is also distended. There is mild edema and inflammation within the large hernia sac. Pancolonic diverticulosis without findings of acute diverticulitis. Vascular/Lymphatic: Aortic atherosclerosis. No enlarged abdominal or pelvic lymph nodes. Reproductive: Uterus and bilateral adnexa are unremarkable. Other: No ascites. Musculoskeletal: No fracture is seen. IMPRESSION: 1. Small bowel obstruction secondary to large hernia to the right of the umbilicus containing cecum and terminal ileum. 2. Small hernia to the left and inferior of umbilicus containing loop of transverse colon without obstruction. 3. Bilateral kidney stones. No hydronephrosis or ureter stone. 4. Pancolonic diverticulosis without findings of acute diverticulitis. 5. Aortic Atherosclerosis (ICD10-I70.0). Electronically Signed   By: Kristine Garbe M.D.   On: 12/20/2018 22:33   Dg Abd 1 View  Result Date: 12/20/2018 CLINICAL DATA:  Check gastric catheter placement EXAM: ABDOMEN - 1 VIEW COMPARISON:  12/20/2018 FINDINGS: Gastric catheter is noted within the stomach. Cardiac shadows within normal limits. The  lungs are clear with the exception of minimal left basilar atelectasis. Stable right renal stone. IMPRESSION: Gastric catheter within the stomach. Electronically Signed   By: Inez Catalina M.D.   On: 12/20/2018 23:48   Mr Brain Wo Contrast  Result Date: 12/26/2018 CLINICAL DATA:  Status post ventral hernia repair, postoperative confusion. EXAM: MRI HEAD WITHOUT CONTRAST TECHNIQUE: Multiplanar, multiecho pulse sequences of the brain and surrounding structures were obtained without intravenous contrast. COMPARISON:  None. FINDINGS: Brain: No acute infarction, hemorrhage, hydrocephalus, extra-axial collection or mass lesion. Moderate cerebral and cerebellar atrophy, not unexpected for  age. Moderately advanced subcortical and periventricular T2 and FLAIR hyperintensities, likely chronic microvascular ischemic change. Vascular: Normal flow voids. Skull and upper cervical spine: Normal marrow signal. Sinuses/Orbits: Negative. Other: None. IMPRESSION: Moderate to advanced atrophy and small vessel disease. No acute intracranial findings. No evidence for occult postoperative infarction or intracranial hemorrhage. Electronically Signed   By: Staci Righter M.D.   On: 12/26/2018 18:25   Dg Abdomen Acute W/chest  Result Date: 12/20/2018 CLINICAL DATA:  85 year old female with a history of right lower quadrant pain EXAM: DG ABDOMEN ACUTE W/ 1V CHEST COMPARISON:  None. FINDINGS: Chest: Cardiomediastinal silhouette within normal limits. No evidence of central vascular congestion. No pneumothorax or pleural effusion. No confluent airspace disease. Abdomen: Portions of the abdomen have been excluded given the patient's body habitus. Partially distended small bowel loops. Relative paucity of gas within the mid abdomen. Calcifications projecting over the right renal silhouette. Surgical changes of the pelvis. No displaced fracture. IMPRESSION: Chest: No radiographic evidence of acute cardiopulmonary disease. Abdomen: Limited plain film abdomen demonstrates borderline distended small bowel loops, with nonspecific bowel gas pattern. If there is concern for acute intra-abdominal process, correlation with CT may be indicated. Questionable right-sided nephrolithiasis. Electronically Signed   By: Corrie Mckusick D.O.   On: 12/20/2018 20:25   Dg Abd Portable 1v  Result Date: 12/21/2018 CLINICAL DATA:  NG position EXAM: PORTABLE ABDOMEN - 1 VIEW COMPARISON:  12/20/2018 FINDINGS: NG tube in the stomach with the tip in the body the stomach unchanged from yesterday. Dilated small bowel loops right upper quadrant consistent with small-bowel obstruction. IMPRESSION: NG tube remains in the body of the  stomach Small  bowel remains dilated. Electronically Signed   By: Franchot Gallo M.D.   On: 12/21/2018 12:13       Subjective: No nausea, vomiting or abdominal pain.   Discharge Exam: Vitals:   12/28/18 2100 12/29/18 0540  BP: (!) 152/88 (!) 145/51  Pulse: 92 89  Resp:  15  Temp:    SpO2:  99%   Vitals:   12/28/18 1325 12/28/18 2031 12/28/18 2100 12/29/18 0540  BP: (!) 160/64 (!) 164/102 (!) 152/88 (!) 145/51  Pulse: 97 96 92 89  Resp: 18 17  15   Temp: 98.1 F (36.7 C) 98 F (36.7 C)    TempSrc: Oral Oral    SpO2: (!) 89% 96%  99%  Weight:      Height:        General: Pt is alert, awake, not in acute distress Cardiovascular: RRR, S1/S2 +, no rubs, no gallops Respiratory: CTA bilaterally, no wheezing, no rhonchi Abdominal: Soft, NT, ND, bowel sounds + Extremities: no edema, no cyanosis    The results of significant diagnostics from this hospitalization (including imaging, microbiology, ancillary and laboratory) are listed below for reference.     Microbiology: Recent Results (from the past 240 hour(s))  Blood culture (routine x 2)  Status: None   Collection Time: 12/20/18 10:36 PM  Result Value Ref Range Status   Specimen Description SITE NOT SPECIFIED  Final   Special Requests   Final    BOTTLES DRAWN AEROBIC AND ANAEROBIC Blood Culture results may not be optimal due to an inadequate volume of blood received in culture bottles   Culture   Final    NO GROWTH 5 DAYS Performed at [facility], [place] [address], [place], [place] [address]    Report Status 12/25/2018 FINAL  Final  Blood culture (routine x 2)     Status: None   Collection Time: 12/20/18 10:38 PM  Result Value Ref Range Status   Specimen Description BLOOD LEFT ANTECUBITAL  Final   Special Requests   Final    BOTTLES DRAWN AEROBIC AND ANAEROBIC Blood Culture results may not be optimal due to an inadequate volume of blood received in culture bottles   Culture   Final    NO GROWTH 5 DAYS Performed at  [facility], [place] [address] [place], [place] [address]    Report Status 12/25/2018 FINAL  Final  Surgical pcr screen     Status: Abnormal   Collection Time: 12/22/18  8:46 AM  Result Value Ref Range Status   MRSA, PCR NEGATIVE NEGATIVE Final   Staphylococcus aureus POSITIVE (A) NEGATIVE Final    Comment: (NOTE) The Xpert SA Assay (FDA approved for NASAL specimens in patients 53 years of age and older), is one component of a comprehensive surveillance program. It is not intended to diagnose infection nor to guide or monitor treatment. Performed at [facility], [place] [address] [place],  [address]      Labs: BNP (last 3 results) No results for input(s): BNP in the last 8760 hours. Basic Metabolic Panel: Recent Labs  Lab 12/23/18 0321 12/24/18 0326 12/25/18 0529 12/26/18 0330 12/27/18 0351  NA 135 138 136 137 139  K 4.6 3.5 3.2* 4.1 4.0  CL 106 106 104 107 107  CO2 19* 23 26 22 22   GLUCOSE 137* 183* 160* 125* 136*  BUN 46* 34* 21 16 13   CREATININE 1.19* 0.78 0.63 0.69 0.69  CALCIUM 10.1 10.0 9.5 9.1 9.4   Liver Function Tests: Recent Labs  Lab 12/24/18 0326 12/25/18 0529  AST 36 30  ALT 18 19  ALKPHOS 70 63  BILITOT 2.6* 1.6*  PROT 5.0* 5.1*  ALBUMIN 1.6* 1.6*   No results for input(s): LIPASE, AMYLASE in the last 168 hours. No results for input(s): AMMONIA in the last 168 hours. CBC: Recent Labs  Lab 12/23/18 0633 12/24/18 0326 12/25/18 0529 12/26/18 0330  WBC 12.6* 12.2* 7.6 7.3  HGB 13.2 11.0* 11.4* 11.4*  HCT 40.8 33.8* 34.0* 35.1*  MCV 92.3 93.4 92.6 92.4  PLT 257 246 258 242   Cardiac Enzymes: No results for input(s): CKTOTAL, CKMB, CKMBINDEX, TROPONINI in the last 168 hours. BNP: Invalid input(s): POCBNP CBG: No results for input(s): GLUCAP in the last 168 hours. D-Dimer No results for input(s): DDIMER in the last 72 hours. Hgb A1c No results for input(s): HGBA1C in the last 72 hours. Lipid Profile No results for  input(s): CHOL, HDL, LDLCALC, TRIG, CHOLHDL, LDLDIRECT in the last 72 hours. Thyroid function studies No results for input(s): TSH, T4TOTAL, T3FREE, THYROIDAB in the last 72 hours.  Invalid input(s): FREET3 Anemia work up No results for input(s): VITAMINB12, FOLATE, FERRITIN, TIBC, IRON, RETICCTPCT in the last 72 hours. Urinalysis No results  found for: COLORURINE, APPEARANCEUR, [place], [place], [place], [place], [place], [place], PROTEINUR, UROBILINOGEN, NITRITE, LEUKOCYTESUR Sepsis Labs Invalid input(s): PROCALCITONIN,  WBC,  LACTICIDVEN Microbiology Recent Results (from the past 240 hour(s))  Blood culture (routine x 2)     Status: None   Collection Time: 12/20/18 10:36 PM  Result Value Ref Range Status   Specimen Description SITE NOT SPECIFIED  Final   Special Requests   Final    BOTTLES DRAWN AEROBIC AND ANAEROBIC Blood Culture results may not be optimal due to an inadequate volume of blood received in culture bottles   Culture   Final    NO GROWTH 5 DAYS Performed at [facility], [place] [address], [place], [place] [address]    Report Status 12/25/2018 FINAL  Final  Blood culture (routine x 2)     Status: None   Collection Time: 12/20/18 10:38 PM  Result Value Ref Range Status   Specimen Description BLOOD LEFT ANTECUBITAL  Final   Special Requests   Final    BOTTLES DRAWN AEROBIC AND ANAEROBIC Blood Culture results may not be optimal due to an inadequate volume of blood received in culture bottles   Culture   Final    NO GROWTH 5 DAYS Performed at [facility], [place] [address], [place], [place] [address]    Report Status 12/25/2018 FINAL  Final  Surgical pcr screen     Status: Abnormal   Collection Time: 12/22/18  8:46 AM  Result Value Ref Range Status   MRSA, PCR NEGATIVE NEGATIVE Final   Staphylococcus aureus POSITIVE (A) NEGATIVE Final    Comment: (NOTE) The Xpert SA Assay (FDA approved for NASAL specimens in patients 47 years of age and older), is one  component of a comprehensive surveillance program. It is not intended to diagnose infection nor to guide or monitor treatment. Performed at [facility], [place] [address] [place], [place] [address]      Time coordinating discharge: 32  minutes  SIGNED:   Hosie Poisson, MD  [facility] 12/29/2018, 9:33 AM Pager   If 7PM-7AM, please contact night-coverage [email or web address] Password [email or web address]

## 2018-12-29 NOTE — Progress Notes (Signed)
7 Days Post-Op  Subjective: CC: Family at bedside for dressing change teaching. Patient denies any abdominal pain, N/V. Passing flatus and had a soft BM this AM. Family reports patient did well eating last night when she they were on the phone with her.   Objective: Vital signs in last 24 hours: Temp:  [98 F (36.7 C)-98.1 F (36.7 C)] 98 F (36.7 C) (04/23 2031) Pulse Rate:  [89-97] 89 (04/24 0540) Resp:  [15-18] 15 (04/24 0540) BP: (145-164)/(51-102) 145/51 (04/24 0540) SpO2:  [89 %-99 %] 99 % (04/24 0540) Last BM Date: 12/28/18  Intake/Output from previous day: 04/23 0701 - 04/24 0700 In: 600.2 [P.O.:300; I.V.:300.2] Out: 1 [Stool:1] Intake/Output this shift: No intake/output data recorded.  PE: Gen: Alert, NAD, pleasant Pulm: Normal rate and effort Abd: Soft,appropriatelytender, non-distended,+BS, midline wound clean with heatly granulation tissue. (see picture below). Prior drain site with tegederm overlying.  Skin: warm and dry, no rashes     Lab Results:  No results for input(s): WBC, HGB, HCT, PLT in the last 72 hours. BMET Recent Labs    12/27/18 0351  NA 139  K 4.0  CL 107  CO2 22  GLUCOSE 136*  BUN 13  CREATININE 0.69  CALCIUM 9.4   PT/INR No results for input(s): LABPROT, INR in the last 72 hours. CMP     Component Value Date/Time   NA 139 12/27/2018 0351   K 4.0 12/27/2018 0351   CL 107 12/27/2018 0351   CO2 22 12/27/2018 0351   GLUCOSE 136 (H) 12/27/2018 0351   BUN 13 12/27/2018 0351   CREATININE 0.69 12/27/2018 0351   CALCIUM 9.4 12/27/2018 0351   PROT 5.1 (L) 12/25/2018 0529   ALBUMIN 1.6 (L) 12/25/2018 0529   AST 30 12/25/2018 0529   ALT 19 12/25/2018 0529   ALKPHOS 63 12/25/2018 0529   BILITOT 1.6 (H) 12/25/2018 0529   GFRNONAA >60 12/27/2018 0351   GFRAA >60 12/27/2018 0351   Lipase     Component Value Date/Time   LIPASE 23 12/20/2018 2039       Studies/Results: No results found.  Anti-infectives:  Anti-infectives (From admission, onward)   Start     Dose/Rate Route Frequency Ordered Stop   12/22/18 1400  piperacillin-tazobactam (ZOSYN) IVPB 3.375 g  Status:  Discontinued     3.375 g 12.5 mL/hr over 240 Minutes Intravenous Every 8 hours 12/22/18 1332 12/28/18 0833   12/22/18 1100  cefoTEtan in Dextrose 5% (CEFOTAN) IVPB 2 g  Status:  Discontinued     2 g 100 mL/hr over 30 Minutes Intravenous Every 12 hours 12/22/18 1047 12/22/18 1315   12/20/18 2130  piperacillin-tazobactam (ZOSYN) IVPB 3.375 g     3.375 g 100 mL/hr over 30 Minutes Intravenous  Once 12/20/18 2124 12/20/18 2329       Assessment/Plan HTN HLD ABL amemia- H/H 11.4/35 (4/21), stable Acute metabolic encephalopathy AKI- Cr 0.69 4/22,stable Hypokalemia - K4.0 4/22,improved  Incarcerated ventral hernia - s/p primary hernia repair with SBRx1 Dr. Rosendo Gros 12/22/18 - POD#7 -ContinueBID dressing changes - Family at bedside for dressing change teaching this AM - From a surgical standpoint, patient okay for discharge.   UXL:KGMW, IVF VTE: SCDs, lovenox ID: Zosyn 4/15>4/23, afebrile  Follow Up - Dr. Rosendo Gros 5/15 POC - Daughters Angelita Ingles and Manuela Schwartz   Plan: Family at bedside this AM. Discussed and taught WTD dressing changes.All questions answered. HH has already been arranged for family at home. Patient is tolerating diet, having bowel function, pain  well controlled, VSS, and felt to be stable for discharge home from a surgical standpoint.    LOS: 8 days    Jillyn Ledger , [facility] 12/29/2018, 10:26 AM Pager: [phone]

## 2018-12-29 NOTE — Progress Notes (Addendum)
Physical Therapy Treatment Patient Details Name: Felicia Stephens MRN: [id number] DOB: 1933-01-02 Today's Date: 12/29/2018    History of Present Illness 85 y/o female who presents with abdominal pain, and N/V, found to have Incarcerated ventral hernia s/p primary hernia repair with small bowel repair 4/17. PMH includes HTN, HLD.    PT Comments    Pt returned to educated patient on Home exercise program.  Pt supine in bed and presents with loose stool down to her ankles.  Pt required mutliple bouts of rolling for hygiene.  Performed short distance back to recliner as bed was soiled.  Pt participated in supine and seated exercises and daughters looked on.      Follow Up Recommendations  Home health PT;Supervision for mobility/OOB     Equipment Recommendations  Rolling walker with 5" wheels(if she needs one)    Recommendations for Other Services       Precautions / Restrictions Precautions Precautions: Fall Precaution Comments: j/p drain removed 12/28/18 Restrictions Weight Bearing Restrictions: No    Mobility  Bed Mobility Overal bed mobility: Needs Assistance Bed Mobility: Rolling Rolling: Min assist Sidelying to sit: Mod assist      General bed mobility comments: Pt required assistance to bring legs to edge of bed and to elevate trunk into sitting.   Transfers Overall transfer level: Needs assistance Equipment used: Rolling walker (2 wheeled) Transfers: Sit to/from Stand Sit to Stand: Min assist Stand pivot transfers: Min assist       General transfer comment: Cues for hand placement to push from bed, patient able to recall sequencing to return to seated surface with good eccentric loading.    Ambulation/Gait Ambulation/Gait assistance: Min assist Gait Distance (Feet): 10 Feet Assistive device: Rolling walker (2 wheeled) Gait Pattern/deviations: Step-through pattern;Trunk flexed;Decreased stride length Gait velocity: decreased   General Gait Details: Assistance  to ambulate back to bed for dressing change.  Pt required assistance for turning and backing.     Stairs             Wheelchair Mobility    Modified Rankin (Stroke Patients Only)       Balance Overall balance assessment: Needs assistance   Sitting balance-Leahy Scale: Fair   Postural control: Posterior lean   Standing balance-Leahy Scale: Poor Standing balance comment: reliance on RW                            Cognition Arousal/Alertness: Awake/alert Behavior During Therapy: Flat affect Overall Cognitive Status: Impaired/Different from baseline Area of Impairment: Memory;Safety/judgement;Following commands                 Orientation Level: Disoriented to;Place;Time;Situation Current Attention Level: Focused Memory: Decreased recall of precautions;Decreased short-term memory Following Commands: Follows one step commands inconsistently;Follows one step commands with increased time Safety/Judgement: Decreased awareness of safety;Decreased awareness of deficits Awareness: Intellectual Problem Solving: Decreased initiation;Difficulty sequencing;Requires verbal cues;Requires tactile cues;Slow processing General Comments: Pt more appropriate during session but easily distracted with perseveration.  She performed better with daughters present.      Exercises General Exercises - Lower Extremity Ankle Circles/Pumps: AROM;Both;10 reps;Supine Quad Sets: AROM;Both;10 reps;Supine Long Arc Quad: AROM;Both;10 reps;Seated Heel Slides: AROM;Both;10 reps;Supine Hip ABduction/ADduction: AROM;Both;10 reps;Supine Straight Leg Raises: AAROM;Both;10 reps;Supine    General Comments        Pertinent Vitals/Pain Pain Assessment: Faces Pain Score: 0-No pain Faces Pain Scale: Hurts little more Pain Location: abdomen during mobility Pain Descriptors / Indicators: Sore;Grimacing;Guarding Pain  Intervention(s): Monitored during session;Repositioned    Home Living                       Prior Function            PT Goals (current goals can now be found in the care plan section) Acute Rehab PT Goals Patient Stated Goal: To see her daughter Potential to Achieve Goals: Fair Progress towards PT goals: Progressing toward goals    Frequency    Min 3X/week      PT Plan Current plan remains appropriate    Co-evaluation              AM-PAC PT "6 Clicks" Mobility   Outcome Measure  Help needed turning from your back to your side while in a flat bed without using bedrails?: A Little Help needed moving from lying on your back to sitting on the side of a flat bed without using bedrails?: A Little Help needed moving to and from a bed to a chair (including a wheelchair)?: A Little Help needed standing up from a chair using your arms (e.g., wheelchair or bedside chair)?: A Little Help needed to walk in hospital room?: A Little Help needed climbing 3-5 steps with a railing? : A Lot 6 Click Score: 17    End of Session Equipment Utilized During Treatment: Gait belt Activity Tolerance: Patient tolerated treatment well Patient left: in chair;with call bell/phone within reach;with chair alarm set Nurse Communication: Mobility status PT Visit Diagnosis: Pain;Difficulty in walking, not elsewhere classified (R26.2);Muscle weakness (generalized) (M62.81);Unsteadiness on feet (R26.81) Pain - part of body: (abdomen)     Time: 4709-2957 PT Time Calculation (min) (ACUTE ONLY): 27 min  Charges:  $Therapeutic Exercise: 8-22 mins $Therapeutic Activity: 8-22 mins                     Governor Rooks, PTA Acute Rehabilitation Services Pager [phone] Office [phone]     Kelden Lavallee Eli Hose 12/29/2018, 2:37 PM

## 2019-10-05 ENCOUNTER — Ambulatory Visit: Payer: Medicare Other

## 2019-11-30 ENCOUNTER — Emergency Department (HOSPITAL_COMMUNITY)
Admission: EM | Admit: 2019-11-30 | Discharge: 2019-11-30 | Disposition: A | Discharge status: Home / Self Care | Payer: Medicare Other | Attending: Emergency Medicine | Admitting: Emergency Medicine

## 2019-11-30 ENCOUNTER — Other Ambulatory Visit: Payer: Self-pay

## 2019-11-30 ENCOUNTER — Emergency Department (HOSPITAL_COMMUNITY): Payer: Medicare Other

## 2019-11-30 ENCOUNTER — Encounter (HOSPITAL_COMMUNITY): Payer: Self-pay | Admitting: Emergency Medicine

## 2019-11-30 DIAGNOSIS — I10 Essential (primary) hypertension: Secondary | ICD-10-CM | POA: Insufficient documentation

## 2019-11-30 DIAGNOSIS — L02211 Cutaneous abscess of abdominal wall: Secondary | ICD-10-CM | POA: Diagnosis not present

## 2019-11-30 DIAGNOSIS — L03311 Cellulitis of abdominal wall: Secondary | ICD-10-CM | POA: Diagnosis not present

## 2019-11-30 DIAGNOSIS — Z79899 Other long term (current) drug therapy: Secondary | ICD-10-CM | POA: Diagnosis not present

## 2019-11-30 DIAGNOSIS — G8918 Other acute postprocedural pain: Secondary | ICD-10-CM | POA: Diagnosis present

## 2019-11-30 LAB — CBC WITH DIFFERENTIAL/PLATELET
Abs Immature Granulocytes: 0.03 10*3/uL (ref 0.00–0.07)
Basophils Absolute: 0 10*3/uL (ref 0.0–0.1)
Basophils Relative: 0 %
Eosinophils Absolute: 0.3 10*3/uL (ref 0.0–0.5)
Eosinophils Relative: 3 %
HCT: 37.6 % (ref 36.0–46.0)
Hemoglobin: 12.1 g/dL (ref 12.0–15.0)
Immature Granulocytes: 0 %
Lymphocytes Relative: 18 %
Lymphs Abs: 1.8 10*3/uL (ref 0.7–4.0)
MCH: 30.9 pg (ref 26.0–34.0)
MCHC: 32.2 g/dL (ref 30.0–36.0)
MCV: 95.9 fL (ref 80.0–100.0)
Monocytes Absolute: 1 10*3/uL (ref 0.1–1.0)
Monocytes Relative: 10 %
Neutro Abs: 6.8 10*3/uL (ref 1.7–7.7)
Neutrophils Relative %: 69 %
Platelets: 209 10*3/uL (ref 150–400)
RBC: 3.92 MIL/uL (ref 3.87–5.11)
RDW: 12.4 % (ref 11.5–15.5)
WBC: 9.9 10*3/uL (ref 4.0–10.5)
nRBC: 0 % (ref 0.0–0.2)

## 2019-11-30 LAB — BASIC METABOLIC PANEL
Anion gap: 7 (ref 5–15)
BUN: 26 mg/dL — ABNORMAL HIGH (ref 8–23)
CO2: 27 mmol/L (ref 22–32)
Calcium: 10 mg/dL (ref 8.9–10.3)
Chloride: 100 mmol/L (ref 98–111)
Creatinine, Ser: 0.75 mg/dL (ref 0.44–1.00)
GFR calc Af Amer: >60 mL/min (ref >60)
GFR calc non Af Amer: >60 mL/min (ref >60)
Glucose, Bld: 97 mg/dL (ref 70–99)
Potassium: 3.7 mmol/L (ref 3.5–5.1)
Sodium: 134 mmol/L — ABNORMAL LOW (ref 135–145)

## 2019-11-30 MED ORDER — DOXYCYCLINE HYCLATE 100 MG PO CAPS: 100.0000 mg | ORAL_CAPSULE | Freq: Two times a day (BID) | ORAL | 0 refills | Status: AC

## 2019-11-30 MED ORDER — SODIUM CHLORIDE 0.9 % IV SOLN: INTRAVENOUS | Status: DC

## 2019-11-30 MED ORDER — IOHEXOL 300 MG/ML  SOLN
100.0000 mL | Freq: Once | INTRAMUSCULAR | Status: AC | PRN
  Administered 2019-11-30: 22:00:00 100 mL via INTRAVENOUS

## 2019-11-30 NOTE — Consult Note (Signed)
Surgical Consultation Requesting provider: Dr. Zenia Resides  Chief Complaint: abdominal wall infection  HPI: This is a very nice 86 year old woman who is about 1 year status post emergency laparotomy, small bowel resection and primary repair of incarcerated hernia (with #1 Novafil's) by Dr. Rosendo Gros.  Her wound healed by secondary intention and she has been doing well.  She notes that she has intermittent hard spots in the periumbilical region preceding bowel movements, and these will then soften.  Her bowel movements have been irregular but manageable with probiotics and bowel regimen.  She started noting swelling and pain to the umbilical region earlier today, and then at about 3 PM noted purulent drainage from the umbilicus.  This has now changed to a more serosanguineous quality.  Denies malodorous quality to the drainage.  Denies fevers.  She does note that through her recovery, she said her wound would occasionally spit sutures.  Allergies  Allergen Reactions  . Hydromorphone     Psychosis  . Tramadol     Psychosis    Past Medical History:  Diagnosis Date  . Hyperlipidemia   . Hypertension     Past Surgical History:  Procedure Laterality Date  . ABDOMINAL SURGERY    . BOWEL RESECTION N/A 12/22/2018   Procedure: SMALL BOWEL RESECTION;  Surgeon: Ralene Ok, MD;  Location: [place];  Service: General;  Laterality: N/A;  . LAPAROTOMY N/A 12/22/2018   Procedure: EXPLORATORY LAPAROTOMY;  Surgeon: Ralene Ok, MD;  Location: [place];  Service: General;  Laterality: N/A;  . VENTRAL HERNIA REPAIR N/A 12/22/2018   Procedure: PRIMARY HERNIA REPAIR;  Surgeon: Ralene Ok, MD;  Location: [facility];  Service: General;  Laterality: N/A;    Family History  Problem Relation Age of Onset  . Breast cancer Sister     Social History   Socioeconomic History  . Marital status: Married    Spouse name: Not on file  . Number of children: Not on file  . Years of education: Not on file  . Highest  education level: Not on file  Occupational History  . Not on file  Tobacco Use  . Smoking status: Never Smoker  . Smokeless tobacco: Never Used  Substance and Sexual Activity  . Alcohol use: No  . Drug use: No  . Sexual activity: Not on file  Other Topics Concern  . Not on file  Social History Narrative  . Not on file   Social Determinants of Health   Financial Resource Strain:   . Difficulty of Paying Living Expenses:   Food Insecurity:   . Worried About Charity fundraiser in the Last Year:   . Arboriculturist in the Last Year:   Transportation Needs:   . Film/video editor (Medical):   Marland Kitchen Lack of Transportation (Non-Medical):   Physical Activity:   . Days of Exercise per Week:   . Minutes of Exercise per Session:   Stress:   . Feeling of Stress :   Social Connections:   . Frequency of Communication with Friends and Family:   . Frequency of Social Gatherings with Friends and Family:   . Attends Religious Services:   . Active Member of Clubs or Organizations:   . Attends Archivist Meetings:   Marland Kitchen Marital Status:     No current facility-administered medications on file prior to encounter.   Current Outpatient Medications on File Prior to Encounter  Medication Sig Dispense Refill  . acetaminophen (TYLENOL) 650 MG CR tablet Take  650 mg by mouth every 8 (eight) hours as needed for pain.    Marland Kitchen atorvastatin (LIPITOR) 10 MG tablet Take 5 mg by mouth daily at 6 PM.     . fluticasone (FLONASE) 50 MCG/ACT nasal spray Place 1 spray into both nostrils daily as needed for allergies.     . hydrochlorothiazide (HYDRODIURIL) 12.5 MG tablet Take 12.5 mg by mouth daily.    Marland Kitchen lisinopril (PRINIVIL,ZESTRIL) 5 MG tablet Take 5 mg by mouth daily.    Marland Kitchen loratadine (CLARITIN) 10 MG tablet Take 10 mg by mouth daily.    . Multiple Vitamin (MULTIVITAMIN) tablet Take 1 tablet by mouth daily.    . Probiotic Product (PROBIOTIC PO) Take 1 capsule by mouth daily.    . feeding supplement,  ENSURE ENLIVE, (ENSURE ENLIVE) LIQD Take 237 mLs by mouth 2 (two) times daily between meals. (Patient not taking: Reported on 11/30/2019) 237 mL 12    Review of Systems: a complete, 10pt review of systems was completed with pertinent positives and negatives as documented in the HPI  Physical Exam: Vitals:   11/30/19 1959  BP: (!) 147/65  Pulse: 84  Resp: 18  Temp: 97.7 F (36.5 C)  SpO2: 99%   Gen: A&Ox3, no distress  Eyes: lids and conjunctivae normal, no icterus. Pupils equally round and reactive to light.  Neck: supple without mass or thyromegaly Chest: respiratory effort is normal. No crepitus or tenderness on palpation of the chest. Breath sounds equal.  Cardiovascular: RRR with palpable distal pulses Gastrointestinal: soft, mildly distended, nontender.  In the central abdomen there is an approximately 10 cm diameter area of induration and erythema.  The navel is at the center of this and this is draining sanguinous serous fluid.  This is probed with a Q-tip and there is a small amount of tracking to the right but no tracking to the left or cephalad or caudad, no tracking deep into the abdominal wall. Lymphatic: no lymphadenopathy in the neck or groin Muscoloskeletal: no clubbing or cyanosis of the fingers.  Strength is symmetrical throughout.  Range of motion of bilateral upper and lower extremities normal with pain, crepitation or contracture. Neuro: cranial nerves grossly intact.  Sensation intact to light touch diffusely. Psych: appropriate mood and affect, normal insight/judgment intact  Skin: warm and dry   CBC Latest Ref Rng & Units 11/30/2019 12/26/2018 12/25/2018  WBC 4.0 - 10.5 K/uL 9.9 7.3 7.6  Hemoglobin 12.0 - 15.0 g/dL 12.1 11.4(L) 11.4(L)  Hematocrit 36.0 - 46.0 % 37.6 35.1(L) 34.0(L)  Platelets 150 - 400 K/uL 209 242 258    CMP Latest Ref Rng & Units 12/27/2018 12/26/2018 12/25/2018  Glucose 70 - 99 mg/dL 136(H) 125(H) 160(H)  BUN 8 - 23 mg/dL 13 16 21   Creatinine  0.44 - 1.00 mg/dL 0.69 0.69 0.63  Sodium 135 - 145 mmol/L 139 137 136  Potassium 3.5 - 5.1 mmol/L 4.0 4.1 3.2(L)  Chloride 98 - 111 mmol/L 107 107 104  CO2 22 - 32 mmol/L 22 22 26   Calcium 8.9 - 10.3 mg/dL 9.4 9.1 9.5  Total Protein 6.5 - 8.1 g/dL - - 5.1(L)  Total Bilirubin 0.3 - 1.2 mg/dL - - 1.6(H)  Alkaline Phos 38 - 126 U/L - - 63  AST 15 - 41 U/L - - 30  ALT 0 - 44 U/L - - 19    No results found for: INR, PROTIME  Imaging: No results found.   A/P: 86 year old woman with abdominal wall cellulitis, history  of emergency laparotomy with bowel resection 1 year ago for incarcerated hernia.  Permanent suture was used.  Agree with current plans for CT scan.  Differential includes enterocutaneous fistula, suture abscess, or simple soft tissue infection.  CT reviewed- fluid collection in the subcu, no obvious communication with bowel. As this is draining, would recommend outlining the cellulitis with permanent marker, wick the opening to endorse ongoing drainage, and PO abx. Will have our office arrange close follow up this week.   Patient Active Problem List   Diagnosis Date Noted  . Ventral hernia with bowel obstruction 12/21/2018  . HTN (hypertension) 12/21/2018       Romana Juniper, MD [facility]  See AMION to contact appropriate on-call provider

## 2019-11-30 NOTE — ED Provider Notes (Signed)
[name]   CSN: [id number] Arrival date & time: 11/30/19  1953     History Chief Complaint  Patient presents with  . Wound Check    Felicia Stephens is a 86 y.o. female.  86 year old female who presents with 3 weeks of periumbilical pain had a prior surgical incision.  Patient does have a history of ventral hernia with bowel ischemia.  States that today she has sudden onset of fluid draining from that area.  She notes that it has been getting more warm to the touch as well as erythematous.  Denies any fever or emesis.  Went to urgent care and concern for possible abscess and was sent here for management.        Past Medical History:  Diagnosis Date  . Hyperlipidemia   . Hypertension     Patient Active Problem List   Diagnosis Date Noted  . Ventral hernia with bowel obstruction 12/21/2018  . HTN (hypertension) 12/21/2018    Past Surgical History:  Procedure Laterality Date  . ABDOMINAL SURGERY    . BOWEL RESECTION N/A 12/22/2018   Procedure: SMALL BOWEL RESECTION;  Surgeon: Ralene Ok, MD;  Location: [place];  Service: General;  Laterality: N/A;  . LAPAROTOMY N/A 12/22/2018   Procedure: EXPLORATORY LAPAROTOMY;  Surgeon: Ralene Ok, MD;  Location: [place];  Service: General;  Laterality: N/A;  . VENTRAL HERNIA REPAIR N/A 12/22/2018   Procedure: PRIMARY HERNIA REPAIR;  Surgeon: Ralene Ok, MD;  Location: [place];  Service: General;  Laterality: N/A;     OB History   No obstetric history on file.     Family History  Problem Relation Age of Onset  . Breast cancer Sister     Social History   Tobacco Use  . Smoking status: Never Smoker  . Smokeless tobacco: Never Used  Substance Use Topics  . Alcohol use: No  . Drug use: No    Home Medications Prior to Admission medications   Medication Sig Start Date End Date Taking? Authorizing Provider  acetaminophen (TYLENOL) 650 MG CR tablet Take 650 mg by  mouth every 8 (eight) hours as needed for pain.   Yes [provider]  atorvastatin (LIPITOR) 10 MG tablet Take 5 mg by mouth daily at 6 PM.    Yes [provider]  fluticasone (FLONASE) 50 MCG/ACT nasal spray Place 1 spray into both nostrils daily as needed for allergies.    Yes [provider]  hydrochlorothiazide (HYDRODIURIL) 12.5 MG tablet Take 12.5 mg by mouth daily. 10/31/18  Yes [provider]  lisinopril (PRINIVIL,ZESTRIL) 5 MG tablet Take 5 mg by mouth daily. 11/10/18  Yes [provider]  loratadine (CLARITIN) 10 MG tablet Take 10 mg by mouth daily.   Yes [provider]  Multiple Vitamin (MULTIVITAMIN) tablet Take 1 tablet by mouth daily.   Yes [provider]  Probiotic Product (PROBIOTIC PO) Take 1 capsule by mouth daily.   Yes [provider]  feeding supplement, ENSURE ENLIVE, (ENSURE ENLIVE) LIQD Take 237 mLs by mouth 2 (two) times daily between meals. Patient not taking: Reported on 11/30/2019 12/29/18   Hosie Poisson, MD    Allergies    Hydromorphone and Tramadol  Review of Systems   Review of Systems  All other systems reviewed and are negative.   Physical Exam Updated Vital Signs BP (!) 147/65 (BP Location: Right Arm)   Pulse 84   Temp 97.7 F (36.5 C) (Oral)  Resp 18   Ht 1.6 m (5\' 3" )   Wt 56.7 kg   SpO2 99%   BMI 22.14 kg/m   Physical Exam Vitals and nursing note reviewed.  Constitutional:      General: She is not in acute distress.    Appearance: Normal appearance. She is well-developed. She is not toxic-appearing.  HENT:     Head: Normocephalic and atraumatic.  Eyes:     General: Lids are normal.     Conjunctiva/sclera: Conjunctivae normal.     Pupils: Pupils are equal, round, and reactive to light.  Neck:     Thyroid: No thyroid mass.     Trachea: No tracheal deviation.  Cardiovascular:     Rate and Rhythm: Normal rate and regular rhythm.     Heart sounds: Normal heart  sounds. No murmur. No gallop.   Pulmonary:     Effort: Pulmonary effort is normal. No respiratory distress.     Breath sounds: Normal breath sounds. No stridor. No decreased breath sounds, wheezing, rhonchi or rales.  Abdominal:     General: Bowel sounds are normal. There is no distension.     Palpations: Abdomen is soft.     Tenderness: There is no abdominal tenderness. There is no rebound.    Musculoskeletal:        General: No tenderness. Normal range of motion.     Cervical back: Normal range of motion and neck supple.  Skin:    General: Skin is warm and dry.     Findings: No abrasion or rash.  Neurological:     Mental Status: She is alert and oriented to person, place, and time.     GCS: GCS eye subscore is 4. GCS verbal subscore is 5. GCS motor subscore is 6.     Cranial Nerves: No cranial nerve deficit.     Sensory: No sensory deficit.  Psychiatric:        Speech: Speech normal.        Behavior: Behavior normal.     ED Results / Procedures / Treatments   Labs (all labs ordered are listed, but only abnormal results are displayed) Labs Reviewed  CBC WITH DIFFERENTIAL/PLATELET  BASIC METABOLIC PANEL    EKG None  Radiology No results found.  Procedures Procedures (including critical care time)  Medications Ordered in ED Medications  0.9 %  sodium chloride infusion (has no administration in time range)    ED Course  I have reviewed the triage vital signs and the nursing notes.  Pertinent labs & imaging results that were available during my care of the patient were reviewed by me and considered in my medical decision making (see chart for details).    MDM Rules/Calculators/A&P                     Patient seen by Dr. Windle Guard from general surgery who agrees with CAT scan.  CT of abdomen shows 4.6 cm anterior abdominal wall abscess.  Dr. Windle Guard reviewed the scans and recommends that patient have a wick placed and agrees with plan to use oral doxycycline and she  will have the surgery office call the patient to schedule follow-up.  Return precautions given to the patient Final Clinical Impression(s) / ED Diagnoses Final diagnoses:  None    Rx / DC Orders ED Discharge Orders    None       Lacretia Leigh, MD 11/30/19 2249

## 2019-11-30 NOTE — ED Triage Notes (Signed)
Patient complaining of incision that she had a surgery done last year. The wound is leaking and painful.

## 2019-11-30 NOTE — ED Notes (Signed)
Daughter at bedside.

## 2019-11-30 NOTE — Discharge Instructions (Signed)
Your surgeon's office will contact you next week for follow-up.  Return here at once if you develop fever, vomiting, severe pain

## 2020-07-20 ENCOUNTER — Ambulatory Visit: Payer: Medicare Other | Attending: Internal Medicine

## 2020-07-20 DIAGNOSIS — Z23 Encounter for immunization: Secondary | ICD-10-CM

## 2020-07-20 NOTE — Progress Notes (Signed)
   Covid-19 Vaccination Clinic  Name:  Felicia Stephens    MRN: [id number] DOB: 11/18/1932  07/20/2020  [name] was observed post Covid-19 immunization for 15 minutes without incident. She was provided with Vaccine Information Sheet and instruction to access the V-Safe system.   [name] was instructed to call 911 with any severe reactions post vaccine: Marland Kitchen Difficulty breathing  . Swelling of face and throat  . A fast heartbeat  . A bad rash all over body  . Dizziness and weakness   Immunizations Administered    No immunizations on file.

## 2020-07-24 IMAGING — CT CT ABD-PELV W/ CM
2 of 5 series · 15 of 46 positions shown, 17 images · IV contrast (APPLIED)
Comparison: December 20, 2018

CLINICAL DATA: Abdominal abscess suspected. Pain around recent
surgical incision.

EXAM:
CT ABDOMEN AND PELVIS WITH CONTRAST
TECHNIQUE: Multidetector CT imaging of the abdomen and pelvis was performed
using the standard protocol following bolus administration of
intravenous contrast.
CONTRAST:  100mL OMNIPAQUE IOHEXOL 300 MG/ML  SOLN

[Series 2: axial st · axial · 0.72mm/px · z∈[+1043,+1403]mm · 12 of 84 slices shown, 14 images]
[im 6/84  soft-tissue]
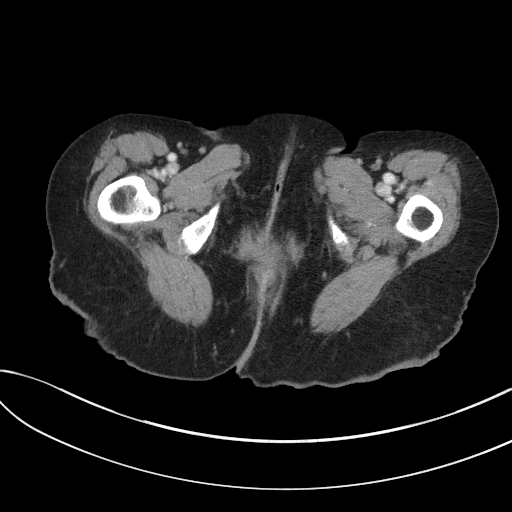
[im 6/84  bone]
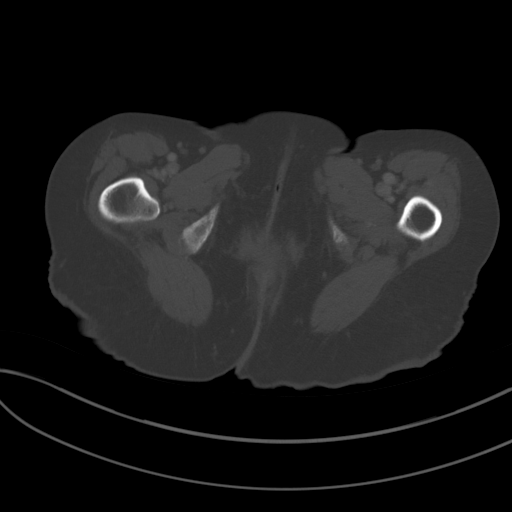
[im 12/84  soft-tissue]
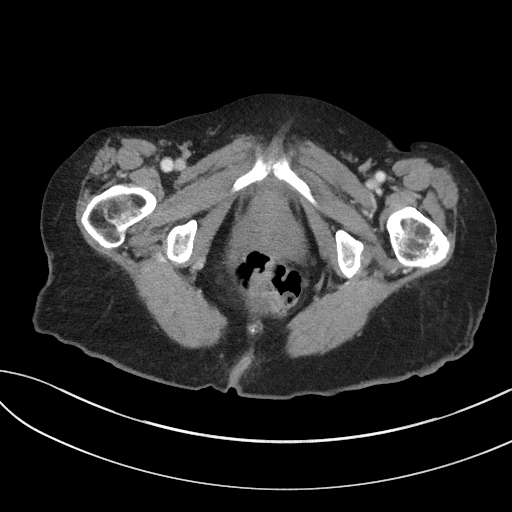
[im 18/84  soft-tissue]
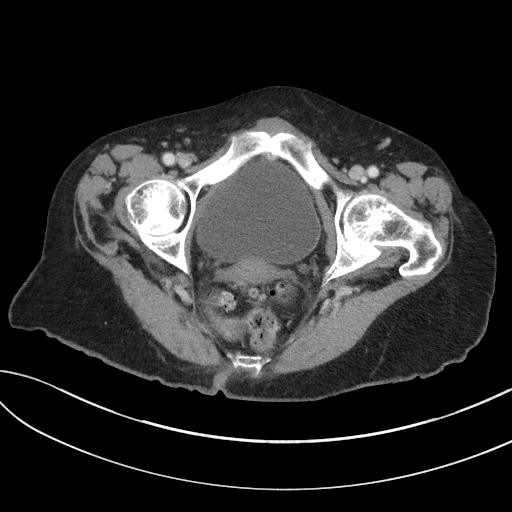
[im 24/84  soft-tissue]
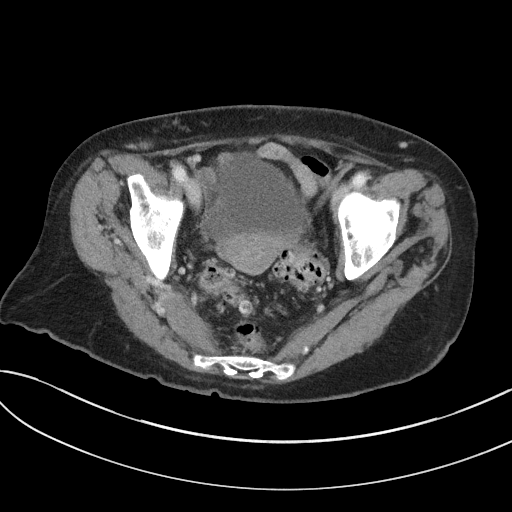
[im 30/84  soft-tissue]
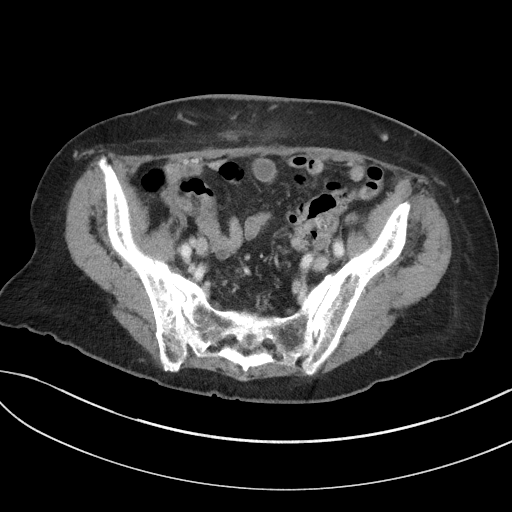
[im 36/84  soft-tissue]
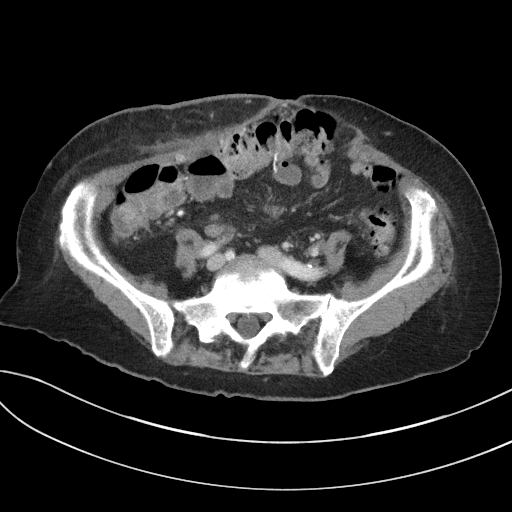
[im 48/84  soft-tissue]
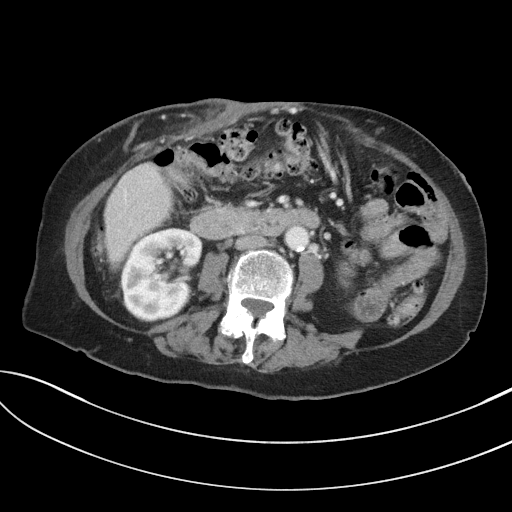
[im 54/84  soft-tissue]
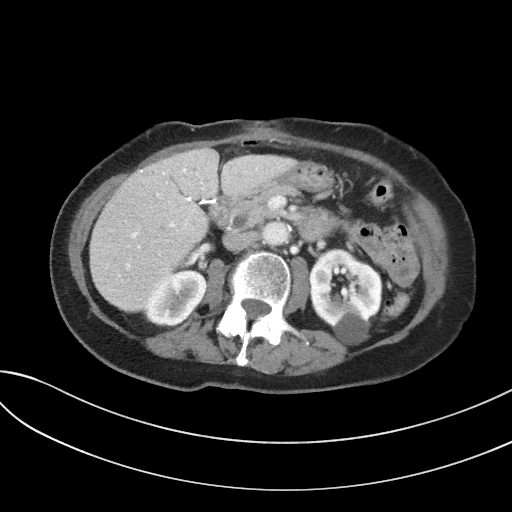
[im 60/84  soft-tissue]
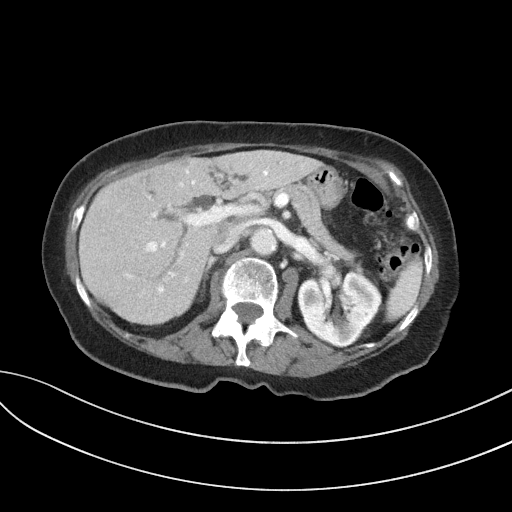
[im 60/84  bone]
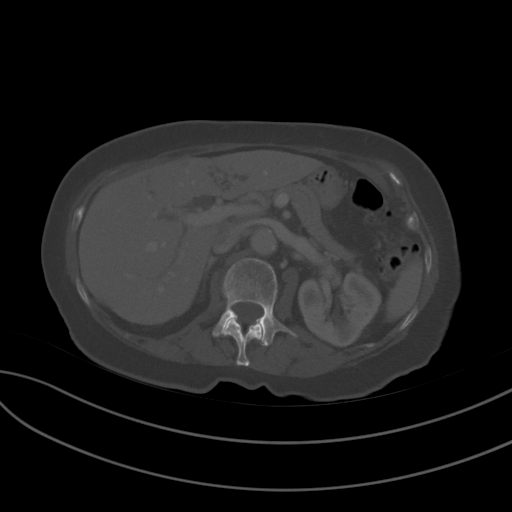
[im 66/84  soft-tissue]
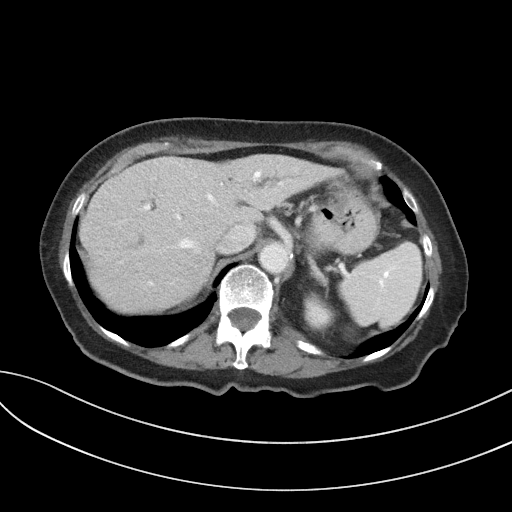
[im 72/84  soft-tissue]
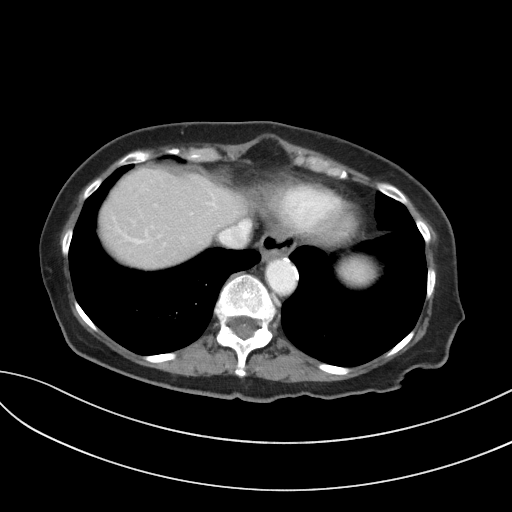
[im 78/84  soft-tissue]
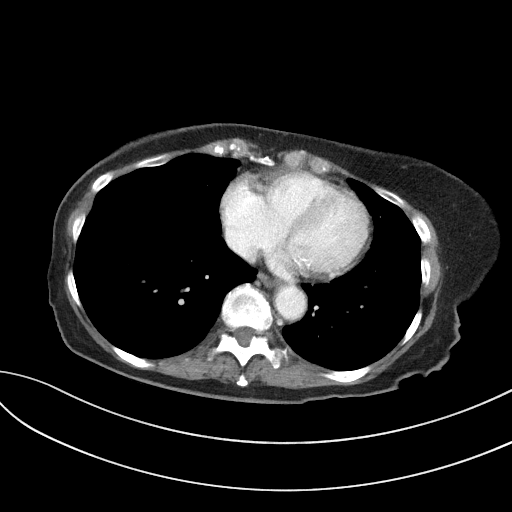

[Series 5: coronal st · coronal · 0.68mm/px · 3 of 80 slices shown]
[im 27/80  soft-tissue]
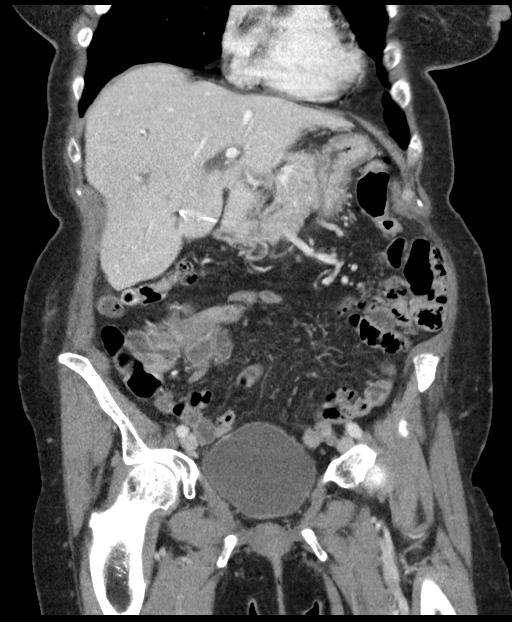
[im 36/80  soft-tissue]
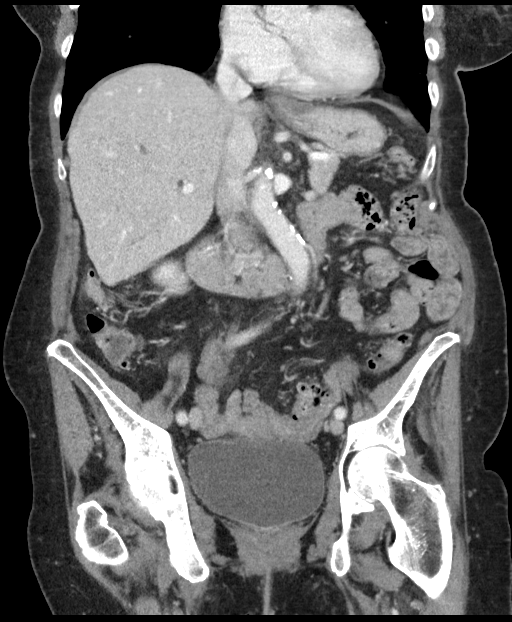
[im 44/80  soft-tissue]
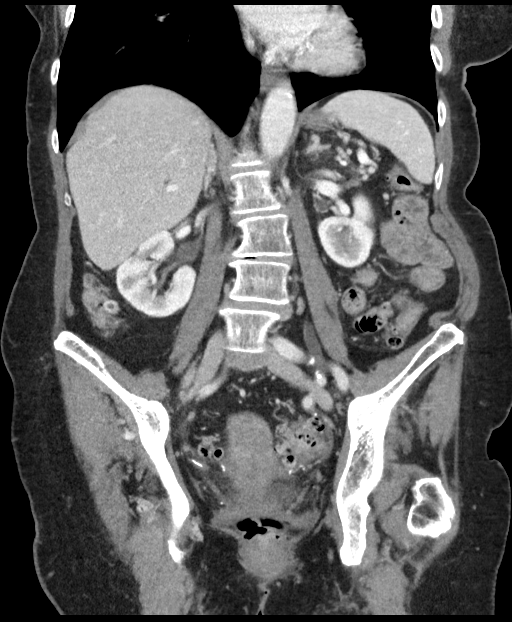

[15 of 46 positions shown; findings below may reference images not displayed]

FINDINGS: Lower chest: The lung bases are clear. The heart size is normal.

Hepatobiliary: The liver is normal. Status post
cholecystectomy.There is relatively stable intrahepatic and
extrahepatic biliary ductal dilatation which is likely related to
the cholecystectomy state.

Pancreas: Normal contours without ductal dilatation. No
peripancreatic fluid collection.

Spleen: No splenic laceration or hematoma.

Adrenals/Urinary Tract:

--Adrenal glands: No adrenal hemorrhage.

--Right kidney/ureter: There is a nonobstructing stone in the lower
pole the right kidney measuring approximately 1 cm.

--Left kidney/ureter: There is a nonobstructing 2-3 mm stone in the
upper pole of the left kidney.

--Urinary bladder: Unremarkable.

Stomach/Bowel:

--Stomach/Duodenum: No hiatal hernia or other gastric abnormality.
Normal duodenal course and caliber.

--Small bowel: There is no evidence for small bowel obstruction.

--Colon: Rectosigmoid diverticulosis without acute inflammation.

--Appendix: Normal.

Vascular/Lymphatic: Atherosclerotic calcification is present within
the non-aneurysmal abdominal aorta, without hemodynamically
significant stenosis.

--No retroperitoneal lymphadenopathy.

--No mesenteric lymphadenopathy.

--No pelvic or inguinal lymphadenopathy.

Reproductive: Unremarkable

Other: In the anterior abdominal wall there is a 4.6 x 2.1 by 4.3 cm
fluid collection with surrounding fat stranding and overlying skin
thickening. This is most consistent with an abscess in the
appropriate clinical setting. There is diastasis of the anterior
abdominal wall.

Musculoskeletal. No acute displaced fractures.
IMPRESSION: 1. There is a 4.6 cm fluid collection in the anterior abdominal wall
most consistent with an abscess in the appropriate clinical setting.
While there is no clear evidence for an enterocutaneous fistula,
evaluation was limited by lack of oral contrast.
2. Nonobstructing bilateral nephrolithiasis.
3. Diverticulosis without CT evidence for diverticulitis.
4.  Aortic Atherosclerosis ([id number]).
